# Patient Record
Sex: Female | Born: 1970 | Hispanic: Yes | Marital: Single | Smoking: Former smoker
Health system: Southern US, Community
[De-identification: ages and names within clinical notes are randomized; demographics above are authoritative.]

## PROBLEM LIST (undated history)

## (undated) DIAGNOSIS — E119 Type 2 diabetes mellitus without complications: Secondary | ICD-10-CM

## (undated) DIAGNOSIS — R7303 Prediabetes: Secondary | ICD-10-CM

## (undated) DIAGNOSIS — E785 Hyperlipidemia, unspecified: Secondary | ICD-10-CM

## (undated) DIAGNOSIS — K760 Fatty (change of) liver, not elsewhere classified: Secondary | ICD-10-CM

## (undated) HISTORY — DX: Fatty (change of) liver, not elsewhere classified: K76.0

## (undated) HISTORY — DX: Type 2 diabetes mellitus without complications: E11.9

## (undated) HISTORY — DX: Hyperlipidemia, unspecified: E78.5

## (undated) HISTORY — DX: Prediabetes: R73.03

---

## 2012-05-17 ENCOUNTER — Emergency Department (HOSPITAL_COMMUNITY): Payer: Self-pay

## 2012-05-17 ENCOUNTER — Observation Stay (HOSPITAL_COMMUNITY)
Admission: EM | Admit: 2012-05-17 | Discharge: 2012-05-19 | Disposition: A | Discharge status: Home / Self Care | Payer: Self-pay | Attending: General Surgery | Admitting: General Surgery

## 2012-05-17 ENCOUNTER — Encounter (HOSPITAL_COMMUNITY): Payer: Self-pay | Admitting: Emergency Medicine

## 2012-05-17 DIAGNOSIS — K8042 Calculus of bile duct with acute cholecystitis without obstruction: Secondary | ICD-10-CM | POA: Insufficient documentation

## 2012-05-17 DIAGNOSIS — R197 Diarrhea, unspecified: Secondary | ICD-10-CM | POA: Insufficient documentation

## 2012-05-17 DIAGNOSIS — F17200 Nicotine dependence, unspecified, uncomplicated: Secondary | ICD-10-CM | POA: Insufficient documentation

## 2012-05-17 DIAGNOSIS — R1011 Right upper quadrant pain: Principal | ICD-10-CM | POA: Insufficient documentation

## 2012-05-17 LAB — HEPATIC FUNCTION PANEL
ALT: 70 U/L — ABNORMAL HIGH (ref 0–35)
Alkaline Phosphatase: 118 U/L — ABNORMAL HIGH (ref 39–117)
Indirect Bilirubin: 0.3 mg/dL (ref 0.3–0.9)
Total Bilirubin: 0.4 mg/dL (ref 0.3–1.2)
Total Protein: 7.6 g/dL (ref 6.0–8.3)

## 2012-05-17 LAB — URINALYSIS, ROUTINE W REFLEX MICROSCOPIC
Bilirubin Urine: NEGATIVE
Ketones, ur: NEGATIVE mg/dL
Nitrite: NEGATIVE
Protein, ur: 100 mg/dL — AB
Specific Gravity, Urine: 1.015 (ref 1.005–1.030)
Urobilinogen, UA: 0.2 mg/dL (ref 0.0–1.0)

## 2012-05-17 LAB — BASIC METABOLIC PANEL
BUN: 11 mg/dL (ref 6–23)
Calcium: 9.2 mg/dL (ref 8.4–10.5)
Creatinine, Ser: 0.5 mg/dL (ref 0.50–1.10)
GFR calc Af Amer: >90 mL/min (ref >90)
GFR calc non Af Amer: >90 mL/min (ref >90)
Glucose, Bld: 96 mg/dL (ref 70–99)
Potassium: 3.7 mEq/L (ref 3.5–5.1)

## 2012-05-17 LAB — CBC WITH DIFFERENTIAL/PLATELET
Basophils Relative: 0 % (ref 0–1)
Eosinophils Absolute: 0.5 10*3/uL (ref 0.0–0.7)
Eosinophils Relative: 5 % (ref 0–5)
Hemoglobin: 13.2 g/dL (ref 12.0–15.0)
Lymphs Abs: 3.6 10*3/uL (ref 0.7–4.0)
MCH: 28.9 pg (ref 26.0–34.0)
MCHC: 34.6 g/dL (ref 30.0–36.0)
MCV: 83.6 fL (ref 78.0–100.0)
Monocytes Relative: 6 % (ref 3–12)
Neutrophils Relative %: 54 % (ref 43–77)
Platelets: 332 10*3/uL (ref 150–400)

## 2012-05-17 LAB — URINE MICROSCOPIC-ADD ON

## 2012-05-17 MED ORDER — HYDROCODONE-ACETAMINOPHEN 5-325 MG PO TABS
2.0000 | ORAL_TABLET | Freq: Once | ORAL | Status: AC
  Administered 2012-05-17: 2 via ORAL
  Filled 2012-05-17: qty 2

## 2012-05-17 NOTE — ED Provider Notes (Signed)
History     CSN: [id number]  Arrival date & time 05/17/12  1619   First MD Initiated Contact with Patient 05/17/12 1706      Chief Complaint  Patient presents with  . Abdominal Pain  . Diarrhea    (Consider location/radiation/quality/duration/timing/severity/associated sxs/prior treatment) HPI Comments: Patient is a 41 year old female with no significant past medical history who presents with an 8 day history of abdominal pain. The pain is located in the RUQ of abdomen and radiates around to her right side and back. The pain is described as cramping and severe. The pain started gradually and progressively worsened since the onset. Eating makes the pain worse. No alleviating factors. The patient has tried nothing for symptoms without relief. Associated symptoms include diarrhea. Patient denies fever, headache, NVD, chest pain, SOB, dysuria, constipation, abnormal vaginal bleeding/discharge.      History reviewed. No pertinent past medical history.  History reviewed. No pertinent past surgical history.  History reviewed. No pertinent family history.  History  Substance Use Topics  . Smoking status: Never Smoker   . Smokeless tobacco: Not on file  . Alcohol Use: No    OB History    Grav Para Term Preterm Abortions TAB SAB Ect Mult Living                  Review of Systems  Gastrointestinal: Positive for abdominal pain and diarrhea.  All other systems reviewed and are negative.    Allergies  Review of patient's allergies indicates no known allergies.  Home Medications  No current outpatient prescriptions on file.  BP 105/70  Pulse 69  Temp 98.1 F (36.7 C) (Oral)  Resp 19  Physical Exam  Nursing note and vitals reviewed. Constitutional: She is oriented to person, place, and time. She appears well-developed and well-nourished. No distress.  HENT:  Head: Normocephalic and atraumatic.  Eyes: Conjunctivae normal and EOM are normal. Pupils are equal, round, and  reactive to light.  Neck: Normal range of motion. Neck supple.  Cardiovascular: Normal rate and regular rhythm.  Exam reveals no gallop and no friction rub.   No murmur heard. Pulmonary/Chest: Effort normal and breath sounds normal. She has no wheezes. She has no rales. She exhibits no tenderness.  Abdominal: Soft. She exhibits no distension. There is tenderness. There is no rebound and no guarding.       Positive Murphy's signs. RUQ and epigastric tenderness to palpation. No suprapubic tenderness.   Musculoskeletal: Normal range of motion.  Neurological: She is alert and oriented to person, place, and time. Coordination normal.       Speech is goal-oriented. Moves limbs without ataxia.   Skin: Skin is warm and dry. She is not diaphoretic.  Psychiatric: She has a normal mood and affect. Her behavior is normal.    ED Course  Procedures (including critical care time)  Labs Reviewed  URINALYSIS, ROUTINE W REFLEX MICROSCOPIC - Abnormal; Notable for the following:    Color, Urine RED (*)  BIOCHEMICALS MAY BE AFFECTED BY COLOR   APPearance CLOUDY (*)     Hgb urine dipstick LARGE (*)     Protein, ur 100 (*)     Leukocytes, UA SMALL (*)     All other components within normal limits  URINE MICROSCOPIC-ADD ON - Abnormal; Notable for the following:    Squamous Epithelial / LPF FEW (*)     Bacteria, UA FEW (*)     All other components within normal limits  HEPATIC  FUNCTION PANEL - Abnormal; Notable for the following:    Albumin 3.3 (*)     AST 55 (*)     ALT 70 (*)     Alkaline Phosphatase 118 (*)     All other components within normal limits  CBC WITH DIFFERENTIAL  BASIC METABOLIC PANEL  LIPASE, BLOOD  POCT PREGNANCY, URINE  SURGICAL PCR SCREEN  URINE CULTURE  COMPREHENSIVE METABOLIC PANEL   US Abdomen Complete  05/17/2012  *RADIOLOGY REPORT*  Clinical Data:  Abdominal pain  COMPLETE ABDOMINAL ULTRASOUND  Comparison:  None.  Findings:  Gallbladder:  Echogenic focus in the neck of the  gallbladder compatible with gallstones.  This does not move.  Positive sonographic Murphy's sign.  Gallbladder wall is 2.5 mm.  Common bile duct:  1.9 mm  Liver:  Echogenic liver without focal liver lesion.  IVC:  Appears normal.  Pancreas:  Not evaluated due to overlying bowel gas  Spleen:  4.7 cm  Right Kidney:  10.0 cm.  Normal  Left Kidney:  10.7 cm.  Normal  Abdominal aorta:  No aneurysm identified.  IMPRESSION: Gallstone is lodged in the neck of the gallbladder.  The patient is tender over the gallbladder.  Fatty infiltration of the liver is suspected.   Original Report Authenticated By: Janeece Riggers, M.D.      No diagnosis found.    MDM  6:01 PM Patient will have US gallbladder and Vicodin for pain.   12:01 AM Patient will be admitted by General Surgery for gallbladder removal in the morning. Patient's pain is not well controlled with Vicodin and liver enzymes are slightly elevated. US shows a stone in the neck of the gallbladder.       Emilia Beck, PA-C 05/18/12 2050  Emilia Beck, PA-C 05/18/12 2051

## 2012-05-17 NOTE — ED Notes (Signed)
Pt c/o lower abd pain with diarrhea x 4 days

## 2012-05-17 NOTE — ED Notes (Signed)
Pt with ultrasound scheduled yesterday that she did not go to for gall bladder issue and still having pain; pt sts thought someone was supposed to call her but she already had scheduled; pt c/o pain  7 days; pt speaks little english

## 2012-05-17 NOTE — ED Notes (Signed)
Pt reports sudden onset of right upper quadrant pain that started 8 days ago; pt reports pain being worse with meals; pt also reports some chest discomfort but primarily c/o RUQ pain that has been present for over a week;

## 2012-05-18 ENCOUNTER — Encounter (HOSPITAL_COMMUNITY): Payer: Self-pay | Admitting: Anesthesiology

## 2012-05-18 ENCOUNTER — Encounter (HOSPITAL_COMMUNITY)
Admission: EM | Disposition: A | Discharge status: Home / Self Care | Payer: Self-pay | Source: Home / Self Care | Attending: Emergency Medicine

## 2012-05-18 ENCOUNTER — Observation Stay (HOSPITAL_COMMUNITY): Payer: Self-pay | Admitting: Anesthesiology

## 2012-05-18 ENCOUNTER — Encounter (HOSPITAL_COMMUNITY): Payer: Self-pay | Admitting: *Deleted

## 2012-05-18 DIAGNOSIS — K8010 Calculus of gallbladder with chronic cholecystitis without obstruction: Secondary | ICD-10-CM

## 2012-05-18 HISTORY — PX: CHOLECYSTECTOMY: SHX55

## 2012-05-18 LAB — URINE CULTURE: Colony Count: 3000

## 2012-05-18 LAB — SURGICAL PCR SCREEN
MRSA, PCR: NEGATIVE
Staphylococcus aureus: NEGATIVE

## 2012-05-18 SURGERY — LAPAROSCOPIC CHOLECYSTECTOMY
Anesthesia: General | Site: Abdomen | Wound class: Contaminated

## 2012-05-18 MED ORDER — ROCURONIUM BROMIDE 100 MG/10ML IV SOLN
INTRAVENOUS | Status: DC | PRN
  Administered 2012-05-18: 20 mg via INTRAVENOUS
  Administered 2012-05-18: 10 mg via INTRAVENOUS

## 2012-05-18 MED ORDER — FENTANYL CITRATE 0.05 MG/ML IJ SOLN
INTRAMUSCULAR | Status: DC | PRN
  Administered 2012-05-18 (×5): 50 ug via INTRAVENOUS

## 2012-05-18 MED ORDER — ONDANSETRON HCL 4 MG/2ML IJ SOLN
4.0000 mg | Freq: Four times a day (QID) | INTRAMUSCULAR | Status: DC | PRN
Start: 2012-05-18 — End: 2012-05-19
  Administered 2012-05-18 – 2012-05-19 (×2): 4 mg via INTRAVENOUS
  Filled 2012-05-18 (×2): qty 2

## 2012-05-18 MED ORDER — LIDOCAINE HCL 1 % IJ SOLN
INTRAMUSCULAR | Status: DC | PRN
  Administered 2012-05-18: 16:00:00

## 2012-05-18 MED ORDER — HYDROMORPHONE HCL PF 1 MG/ML IJ SOLN
1.0000 mg | INTRAMUSCULAR | Status: DC | PRN
  Administered 2012-05-18 – 2012-05-19 (×4): 1 mg via INTRAVENOUS
  Filled 2012-05-18 (×4): qty 1

## 2012-05-18 MED ORDER — SUCCINYLCHOLINE CHLORIDE 20 MG/ML IJ SOLN
INTRAMUSCULAR | Status: DC | PRN
  Administered 2012-05-18: 100 mg via INTRAVENOUS

## 2012-05-18 MED ORDER — GLYCOPYRROLATE 0.2 MG/ML IJ SOLN
INTRAMUSCULAR | Status: DC | PRN
  Administered 2012-05-18: 0.4 mg via INTRAVENOUS

## 2012-05-18 MED ORDER — DEXTROSE IN LACTATED RINGERS 5 % IV SOLN
INTRAVENOUS | Status: DC
  Administered 2012-05-18: 07:00:00 via INTRAVENOUS
  Administered 2012-05-18: 1000 mL via INTRAVENOUS
  Administered 2012-05-19: 06:00:00 via INTRAVENOUS

## 2012-05-18 MED ORDER — HYDROMORPHONE HCL PF 1 MG/ML IJ SOLN
0.2500 mg | INTRAMUSCULAR | Status: DC | PRN
  Administered 2012-05-18 (×3): 0.5 mg via INTRAVENOUS

## 2012-05-18 MED ORDER — ONDANSETRON HCL 4 MG/2ML IJ SOLN: 4.0000 mg | Freq: Once | INTRAMUSCULAR | Status: DC | PRN

## 2012-05-18 MED ORDER — PROPOFOL 10 MG/ML IV BOLUS
INTRAVENOUS | Status: DC | PRN
  Administered 2012-05-18: 150 mg via INTRAVENOUS
  Administered 2012-05-18: 50 mg via INTRAVENOUS

## 2012-05-18 MED ORDER — PIPERACILLIN-TAZOBACTAM 3.375 G IVPB
3.3750 g | Freq: Three times a day (TID) | INTRAVENOUS | Status: DC
  Administered 2012-05-18 (×3): 3.375 g via INTRAVENOUS
  Filled 2012-05-18 (×5): qty 50

## 2012-05-18 MED ORDER — SODIUM CHLORIDE 0.9 % IR SOLN
Status: DC | PRN
  Administered 2012-05-18: 1000 mL

## 2012-05-18 MED ORDER — ONDANSETRON HCL 4 MG/2ML IJ SOLN
INTRAMUSCULAR | Status: DC | PRN
  Administered 2012-05-18: 4 mg via INTRAVENOUS

## 2012-05-18 MED ORDER — LIDOCAINE HCL (PF) 1 % IJ SOLN
INTRAMUSCULAR | Status: AC
  Filled 2012-05-18: qty 30

## 2012-05-18 MED ORDER — PANTOPRAZOLE SODIUM 40 MG IV SOLR
40.0000 mg | Freq: Every day | INTRAVENOUS | Status: DC
  Administered 2012-05-18 (×2): 40 mg via INTRAVENOUS
  Filled 2012-05-18 (×3): qty 40

## 2012-05-18 MED ORDER — PIPERACILLIN-TAZOBACTAM 3.375 G IVPB
3.3750 g | Freq: Three times a day (TID) | INTRAVENOUS | Status: DC
  Administered 2012-05-18 – 2012-05-19 (×2): 3.375 g via INTRAVENOUS
  Filled 2012-05-18 (×3): qty 50

## 2012-05-18 MED ORDER — 0.9 % SODIUM CHLORIDE (POUR BTL) OPTIME
TOPICAL | Status: DC | PRN
  Administered 2012-05-18: 1000 mL

## 2012-05-18 MED ORDER — MIDAZOLAM HCL 5 MG/5ML IJ SOLN
INTRAMUSCULAR | Status: DC | PRN
  Administered 2012-05-18: 2 mg via INTRAVENOUS

## 2012-05-18 MED ORDER — LIDOCAINE HCL (CARDIAC) 20 MG/ML IV SOLN
INTRAVENOUS | Status: DC | PRN
  Administered 2012-05-18: 60 mg via INTRAVENOUS

## 2012-05-18 MED ORDER — BUPIVACAINE-EPINEPHRINE PF 0.25-1:200000 % IJ SOLN
INTRAMUSCULAR | Status: AC
  Filled 2012-05-18: qty 30

## 2012-05-18 MED ORDER — NEOSTIGMINE METHYLSULFATE 1 MG/ML IJ SOLN
INTRAMUSCULAR | Status: DC | PRN
  Administered 2012-05-18: 3 mg via INTRAVENOUS

## 2012-05-18 MED ORDER — SODIUM CHLORIDE 0.9 % IV SOLN: INTRAVENOUS | Status: DC | PRN

## 2012-05-18 MED ORDER — LACTATED RINGERS IV SOLN
INTRAVENOUS | Status: DC
  Administered 2012-05-18: 15:00:00 via INTRAVENOUS

## 2012-05-18 MED ORDER — HYDROMORPHONE HCL PF 1 MG/ML IJ SOLN
INTRAMUSCULAR | Status: AC
  Filled 2012-05-18: qty 1

## 2012-05-18 SURGICAL SUPPLY — 49 items
APPLIER CLIP ROT 10 11.4 M/L (STAPLE) ×2
BLADE SURG ROTATE 9660 (MISCELLANEOUS) IMPLANT
CANISTER SUCTION 2500CC (MISCELLANEOUS) ×2 IMPLANT
CHLORAPREP W/TINT 26ML (MISCELLANEOUS) ×4 IMPLANT
CLIP APPLIE ROT 10 11.4 M/L (STAPLE) ×1 IMPLANT
CLOTH BEACON ORANGE TIMEOUT ST (SAFETY) ×2 IMPLANT
COVER MAYO STAND STRL (DRAPES) ×2 IMPLANT
COVER SURGICAL LIGHT HANDLE (MISCELLANEOUS) ×2 IMPLANT
DECANTER SPIKE VIAL GLASS SM (MISCELLANEOUS) ×4 IMPLANT
DERMABOND ADHESIVE PROPEN (GAUZE/BANDAGES/DRESSINGS) ×1
DERMABOND ADVANCED (GAUZE/BANDAGES/DRESSINGS) ×1
DERMABOND ADVANCED .7 DNX12 (GAUZE/BANDAGES/DRESSINGS) ×1 IMPLANT
DERMABOND ADVANCED .7 DNX6 (GAUZE/BANDAGES/DRESSINGS) ×1 IMPLANT
DRAPE C-ARM 42X72 X-RAY (DRAPES) ×2 IMPLANT
DRAPE UTILITY 15X26 W/TAPE STR (DRAPE) ×4 IMPLANT
DRAPE WARM FLUID 44X44 (DRAPE) ×2 IMPLANT
ELECT REM PT RETURN 9FT ADLT (ELECTROSURGICAL) ×2
ELECTRODE REM PT RTRN 9FT ADLT (ELECTROSURGICAL) ×1 IMPLANT
FILTER SMOKE EVAC LAPAROSHD (FILTER) IMPLANT
GLOVE BIO SURGEON STRL SZ 6 (GLOVE) ×2 IMPLANT
GLOVE BIOGEL PI IND STRL 6.5 (GLOVE) ×2 IMPLANT
GLOVE BIOGEL PI INDICATOR 6.5 (GLOVE) ×2
GLOVE SURG SS PI 6.0 STRL IVOR (GLOVE) ×2 IMPLANT
GLOVE SURG SS PI 7.5 STRL IVOR (GLOVE) ×2 IMPLANT
GOWN PREVENTION PLUS XXLARGE (GOWN DISPOSABLE) ×2 IMPLANT
GOWN STRL NON-REIN LRG LVL3 (GOWN DISPOSABLE) ×6 IMPLANT
HEMOSTAT SNOW SURGICEL 2X4 (HEMOSTASIS) ×2 IMPLANT
KIT BASIN OR (CUSTOM PROCEDURE TRAY) ×2 IMPLANT
KIT ROOM TURNOVER OR (KITS) ×2 IMPLANT
NS IRRIG 1000ML POUR BTL (IV SOLUTION) ×2 IMPLANT
PAD ARMBOARD 7.5X6 YLW CONV (MISCELLANEOUS) ×4 IMPLANT
POUCH SPECIMEN RETRIEVAL 10MM (ENDOMECHANICALS) ×2 IMPLANT
PUNCH CORNEAL DONOR 7.5MM (MISCELLANEOUS) ×2 IMPLANT
SCISSORS LAP 5X35 DISP (ENDOMECHANICALS) ×2 IMPLANT
SET CHOLANGIOGRAPH 5 50 .035 (SET/KITS/TRAYS/PACK) ×2 IMPLANT
SET IRRIG TUBING LAPAROSCOPIC (IRRIGATION / IRRIGATOR) ×2 IMPLANT
SLEEVE ENDOPATH XCEL 5M (ENDOMECHANICALS) ×4 IMPLANT
SLEEVE Z-THREAD 5X100MM (TROCAR) IMPLANT
SPECIMEN JAR SMALL (MISCELLANEOUS) ×2 IMPLANT
SUT MNCRL AB 4-0 PS2 18 (SUTURE) ×2 IMPLANT
TOWEL OR 17X24 6PK STRL BLUE (TOWEL DISPOSABLE) ×2 IMPLANT
TOWEL OR 17X26 10 PK STRL BLUE (TOWEL DISPOSABLE) ×2 IMPLANT
TRAY LAPAROSCOPIC (CUSTOM PROCEDURE TRAY) ×2 IMPLANT
TROCAR XCEL BLUNT TIP 100MML (ENDOMECHANICALS) ×2 IMPLANT
TROCAR XCEL NON-BLD 11X100MML (ENDOMECHANICALS) ×2 IMPLANT
TROCAR XCEL NON-BLD 5MMX100MML (ENDOMECHANICALS) ×2 IMPLANT
TROCAR Z-THREAD FIOS 11X100 BL (TROCAR) IMPLANT
TROCAR Z-THREAD FIOS 5X100MM (TROCAR) IMPLANT
WATER STERILE IRR 1000ML POUR (IV SOLUTION) IMPLANT

## 2012-05-18 NOTE — H&P (Signed)
Samaiyah Howes is an 41 y.o. female.   Chief Complaint: right upper quadrant pain HPI: the patient is a 41 year old female with right upper quadrant pain that started approximately one day ago. The patient states that the pain began after eating. She states she's had some nausea since being in the ED. Patient states she has not had any pain like this in the past.  History reviewed. No pertinent past medical history.  History reviewed. No pertinent past surgical history.  History reviewed. No pertinent family history. Social History:  reports that she has never smoked. She does not have any smokeless tobacco history on file. She reports that she does not drink alcohol or use illicit drugs.  Allergies: No Known Allergies   (Not in a hospital admission)  Results for orders placed during the hospital encounter of 05/17/12 (from the past 48 hour(s))  CBC WITH DIFFERENTIAL     Status: Normal   Collection Time   05/17/12  4:55 PM      Component Value Range Comment   WBC 10.2  4.0 - 10.5 K/uL    RBC 4.57  3.87 - 5.11 MIL/uL    Hemoglobin 13.2  12.0 - 15.0 g/dL    HCT 16.1  09.6 - 04.5 %    MCV 83.6  78.0 - 100.0 fL    MCH 28.9  26.0 - 34.0 pg    MCHC 34.6  30.0 - 36.0 g/dL    RDW 40.9  81.1 - 91.4 %    Platelets 332  150 - 400 K/uL    Neutrophils Relative 54  43 - 77 %    Neutro Abs 5.5  1.7 - 7.7 K/uL    Lymphocytes Relative 35  12 - 46 %    Lymphs Abs 3.6  0.7 - 4.0 K/uL    Monocytes Relative 6  3 - 12 %    Monocytes Absolute 0.6  0.1 - 1.0 K/uL    Eosinophils Relative 5  0 - 5 %    Eosinophils Absolute 0.5  0.0 - 0.7 K/uL    Basophils Relative 0  0 - 1 %    Basophils Absolute 0.0  0.0 - 0.1 K/uL   BASIC METABOLIC PANEL     Status: Normal   Collection Time   05/17/12  4:55 PM      Component Value Range Comment   Sodium 135  135 - 145 mEq/L    Potassium 3.7  3.5 - 5.1 mEq/L    Chloride 102  96 - 112 mEq/L    CO2 23  19 - 32 mEq/L    Glucose, Bld 96  70 - 99 mg/dL    BUN 11   6 - 23 mg/dL    Creatinine, Ser 7.82  0.50 - 1.10 mg/dL    Calcium 9.2  8.4 - 95.6 mg/dL    GFR calc non Af Amer >90  >90 mL/min    GFR calc Af Amer >90  >90 mL/min   LIPASE, BLOOD     Status: Normal   Collection Time   05/17/12  4:55 PM      Component Value Range Comment   Lipase 23  11 - 59 U/L   POCT PREGNANCY, URINE     Status: Normal   Collection Time   05/17/12  5:04 PM      Component Value Range Comment   Preg Test, Ur NEGATIVE  NEGATIVE   URINALYSIS, ROUTINE W REFLEX MICROSCOPIC     Status: Abnormal  Collection Time   05/17/12  5:07 PM      Component Value Range Comment   Color, Urine RED (*) YELLOW BIOCHEMICALS MAY BE AFFECTED BY COLOR   APPearance CLOUDY (*) CLEAR    Specific Gravity, Urine 1.015  1.005 - 1.030    pH 5.5  5.0 - 8.0    Glucose, UA NEGATIVE  NEGATIVE mg/dL    Hgb urine dipstick LARGE (*) NEGATIVE    Bilirubin Urine NEGATIVE  NEGATIVE    Ketones, ur NEGATIVE  NEGATIVE mg/dL    Protein, ur 161 (*) NEGATIVE mg/dL    Urobilinogen, UA 0.2  0.0 - 1.0 mg/dL    Nitrite NEGATIVE  NEGATIVE    Leukocytes, UA SMALL (*) NEGATIVE   URINE MICROSCOPIC-ADD ON     Status: Abnormal   Collection Time   05/17/12  5:07 PM      Component Value Range Comment   Squamous Epithelial / LPF FEW (*) RARE    WBC, UA 3-6  <3 WBC/hpf    RBC / HPF TOO NUMEROUS TO COUNT  <3 RBC/hpf    Bacteria, UA FEW (*) RARE   HEPATIC FUNCTION PANEL     Status: Abnormal   Collection Time   05/17/12  8:33 PM      Component Value Range Comment   Total Protein 7.6  6.0 - 8.3 g/dL    Albumin 3.3 (*) 3.5 - 5.2 g/dL    AST 55 (*) 0 - 37 U/L    ALT 70 (*) 0 - 35 U/L    Alkaline Phosphatase 118 (*) 39 - 117 U/L    Total Bilirubin 0.4  0.3 - 1.2 mg/dL    Bilirubin, Direct 0.1  0.0 - 0.3 mg/dL    Indirect Bilirubin 0.3  0.3 - 0.9 mg/dL    US Abdomen Complete  05/17/2012  *RADIOLOGY REPORT*  Clinical Data:  Abdominal pain  COMPLETE ABDOMINAL ULTRASOUND  Comparison:  None.  Findings:  Gallbladder:   Echogenic focus in the neck of the gallbladder compatible with gallstones.  This does not move.  Positive sonographic Murphy's sign.  Gallbladder wall is 2.5 mm.  Common bile duct:  1.9 mm  Liver:  Echogenic liver without focal liver lesion.  IVC:  Appears normal.  Pancreas:  Not evaluated due to overlying bowel gas  Spleen:  4.7 cm  Right Kidney:  10.0 cm.  Normal  Left Kidney:  10.7 cm.  Normal  Abdominal aorta:  No aneurysm identified.  IMPRESSION: Gallstone is lodged in the neck of the gallbladder.  The patient is tender over the gallbladder.  Fatty infiltration of the liver is suspected.   Original Report Authenticated By: Janeece Riggers, M.D.     Review of Systems  Unable to perform ROS HENT: Negative.   Eyes: Negative.   Respiratory: Negative.   Cardiovascular: Negative.   Gastrointestinal: Positive for nausea and abdominal pain.  Genitourinary: Negative.   Musculoskeletal: Negative.   Skin: Negative.     Blood pressure 105/70, pulse 69, temperature 98.1 F (36.7 C), temperature source Oral, resp. rate 19. Physical Exam  Constitutional: She is oriented to person, place, and time. She appears well-developed and well-nourished.  HENT:  Head: Normocephalic and atraumatic.  Eyes: Conjunctivae normal and EOM are normal. Pupils are equal, round, and reactive to light.  Neck: Normal range of motion.  Cardiovascular: Normal rate, regular rhythm and normal heart sounds.   Respiratory: Effort normal and breath sounds normal.  GI: Soft. Bowel sounds are normal.  She exhibits no mass. There is tenderness (ruq). There is no rebound and no guarding.  Musculoskeletal: Normal range of motion.  Neurological: She is alert and oriented to person, place, and time.     Assessment/Plan Patient is a 41 year old female with symptomatic choledocholithiasis and unrelenting right upper quadrant pain  1. Patient be kept n.p.o., and started on IV fluids. 2.I discussed the need for the patient to undergo a  laparoscopic cholecystectomy, and the likelihood of symptoms to return to the patient leaves the ED. All questions were answered and the patient wished to proceed with the operation. 3.All risks and benefits were discussed with the patient, to generally include infection, bleeding, damage to surrounding structures, and recurrence. Alternatives were offered and described.  All questions were answered and the patient voiced understanding of the procedure and wishes to proceed at this point.   Marigene Ehlers., Nathanyal Ashmead 05/18/2012, 12:06 AM

## 2012-05-18 NOTE — Anesthesia Preprocedure Evaluation (Addendum)
Anesthesia Evaluation  Patient identified by MRN, date of birth, ID band Patient awake    Reviewed: Allergy & Precautions, H&P , NPO status , Patient's Chart, lab work & pertinent test results  Airway       Dental  (+) Dental Advisory Given and Teeth Intact   Pulmonary Current Smoker,          Cardiovascular     Neuro/Psych    GI/Hepatic GERD-  ,  Endo/Other    Renal/GU      Musculoskeletal   Abdominal   Peds  Hematology   Anesthesia Other Findings   Reproductive/Obstetrics                          Anesthesia Physical Anesthesia Plan  ASA: I  Anesthesia Plan: General   Post-op Pain Management:    Induction: Intravenous  Airway Management Planned: Oral ETT  Additional Equipment:   Intra-op Plan:   Post-operative Plan: Extubation in OR  Informed Consent: I have reviewed the patients History and Physical, chart, labs and discussed the procedure including the risks, benefits and alternatives for the proposed anesthesia with the patient or authorized representative who has indicated his/her understanding and acceptance.     Plan Discussed with: CRNA, Anesthesiologist and Surgeon  Anesthesia Plan Comments:         Anesthesia Quick Evaluation

## 2012-05-18 NOTE — Transfer of Care (Signed)
Immediate Anesthesia Transfer of Care Note  Patient: Bonnie Palmer  Procedure(s) Performed: Procedure(s) (LRB) with comments: LAPAROSCOPIC CHOLECYSTECTOMY (N/A)  Patient Location: PACU  Anesthesia Type:General  Level of Consciousness: awake, alert  and oriented  Airway & Oxygen Therapy: Patient Spontanous Breathing  Post-op Assessment: Report given to PACU RN and Post -op Vital signs reviewed and stable  Post vital signs: Reviewed and stable  Complications: No apparent anesthesia complications

## 2012-05-18 NOTE — Progress Notes (Signed)
Nutrition Brief Note  Patient identified on the Malnutrition Screening Tool (MST) Report. Upon chart review, no nutrition issues identified.  Body mass index is 34.88 kg/(m^2). Pt meets criteria for Obese Class I based on current BMI.   Current diet order is NPO. Labs and medications reviewed.   No nutrition interventions warranted at this time. If nutrition issues arise, please consult RD.   Jarold Motto MS, RD, LDN Pager: [phone] After-hours pager: [phone]

## 2012-05-18 NOTE — Progress Notes (Signed)
ARRIVED FROM PACU, SLEEPY BUT EASILY AROUSABLE, FRIENDS AT BEDSIDE, DENIES NAUSEA/PAIN AT THIS TIME

## 2012-05-18 NOTE — Anesthesia Procedure Notes (Signed)
Procedure Name: Intubation Date/Time: 05/18/2012 3:17 PM Performed by: Elon Alas Pre-anesthesia Checklist: Patient identified, Timeout performed, Emergency Drugs available, Suction available and Patient being monitored Patient Re-evaluated:Patient Re-evaluated prior to inductionOxygen Delivery Method: Circle system utilized Preoxygenation: Pre-oxygenation with 100% oxygen Intubation Type: IV induction Ventilation: Mask ventilation without difficulty Laryngoscope Size: Mac and 3 Grade View: Grade I Tube type: Oral Tube size: 7.0 mm Number of attempts: 1 Airway Equipment and Method: Stylet Placement Confirmation: positive ETCO2,  ETT inserted through vocal cords under direct vision and breath sounds checked- equal and bilateral Secured at: 20 cm Tube secured with: Tape Dental Injury: Teeth and Oropharynx as per pre-operative assessment  Comments: Atraumatic intubation, however pt had limited mouth opening once asleep.

## 2012-05-18 NOTE — Preoperative (Signed)
Beta Blockers   Reason not to administer Beta Blockers:Not Applicable 

## 2012-05-18 NOTE — Op Note (Signed)
Laparoscopic Cholecystectomy Procedure Note  Indications: This patient presents with acute cholecystitis and cholelithiasis and will undergo laparoscopic cholecystectomy.  Pre-operative Diagnosis: see above  Post-operative Diagnosis: Same  Surgeon: Almond Lint   Assistants: Barnetta Chapel, PA-C  Anesthesia: General endotracheal anesthesia and local  ASA Class: 2  Procedure Details  The patient was seen again in the Holding Room. The risks, benefits, complications, treatment options, and expected outcomes were discussed with the patient. The possibilities of  bleeding, recurrent infection, damage to nearby structures, the need for additional procedures, failure to diagnose a condition, the possible need to convert to an open procedure, and creating a complication requiring transfusion or operation were discussed with the patient. The likelihood of improving the patient's symptoms with return to their baseline status is good.    The patient and/or family concurred with the proposed plan, giving informed consent. The site of surgery properly noted. The patient was taken to Operating Room, and the procedure verified as Laparoscopic Cholecystectomy with Intraoperative Cholangiogram. A Time Out was held and the above information confirmed.  Prior to the induction of general anesthesia, antibiotic prophylaxis was administered. General endotracheal anesthesia was then administered and tolerated well. After the induction, the abdomen was prepped with Chloraprep and draped in the sterile fashion. The patient was positioned in the supine position.  Local anesthetic agent was injected into the skin near the umbilicus and an incision made. We dissected down to the abdominal fascia with blunt dissection.  The fascia was incised vertically and we entered the peritoneal cavity bluntly.  A pursestring suture of 0-Vicryl was placed around the fascial opening.  The Hasson cannula was inserted and secured with  the stay suture.  Pneumoperitoneum was then created with CO2 and tolerated well without any adverse changes in the patient's vital signs. An 11-mm port was placed in the subxiphoid position.  Two 5-mm ports were placed in the right upper quadrant. All skin incisions were infiltrated with a local anesthetic agent before making the incision and placing the trocars.   We positioned the patient in reverse Trendelenburg, tilted slightly to the patient's left. The gallbladder was very distended.  It was aspirated with a Nezhat suction with clear bile.  The omental adhesions were taken down with cautery. The gallbladder was identified, the fundus grasped and retracted cephalad. Adhesions were lysed bluntly and with the electrocautery where indicated, taking care not to injure any adjacent organs or viscus. The infundibulum was grasped and retracted laterally, exposing the peritoneum overlying the triangle of Calot. This was then divided and exposed in a blunt fashion. A critical view of the cystic duct and cystic artery was obtained.  The cystic duct was clearly identified and bluntly dissected circumferentially. The cystic duct was ligated with a clip distally.   An incision was made in the cystic duct and the [facility] cholangiogram catheter was attempted to be introduced. The catheter met resistance and would not pass despite several attempts.  The cholangiogram was aborted.  The cystic duct was then ligated with clips and divided. The cystic artery was identified, dissected free, ligated with clips and divided as well.   The gallbladder was dissected from the liver bed in retrograde fashion with the electrocautery. The gallbladder was removed and placed in an Endocatch bag.  The gallbladder and Endocatch bag were then removed through the umbilical port site.  The liver bed was irrigated and inspected. Hemostasis was achieved with the electrocautery. Copious irrigation was utilized and was repeatedly aspirated until  clear.  We again inspected the right upper quadrant for hemostasis.  Pneumoperitoneum was released as we removed the trocars.   The pursestring suture was used to close the umbilical fascia.  4-0 Monocryl was used to close the skin.   The skin was cleaned and dry, and Dermabond was applied. The patient was then extubated and brought to the recovery room in stable condition. Instrument, sponge, and needle counts were correct at closure and at the conclusion of the case.   Findings: Acute cholecystitis with cholelithiasis, hydrops of the gallbladder and aborted cholangiogram, but normal caliber cystic duct.    Estimated Blood Loss: <25 mL         Drains: none          Specimens: Gallbladder to pathology       Complications: None; patient tolerated the procedure well.         Disposition: PACU - hemodynamically stable.         Condition: stable

## 2012-05-18 NOTE — Progress Notes (Signed)
Patient ID: Bonnie Palmer, female   DOB: 1971-03-06, 41 y.o.   MRN: [id number] Patient feels ok.  Spoke to her via Nurse, learning disability on my phone.  She has no questions about surgery.  I informed her it would be this afternoon.  She understands and is agreeable to proceed with surgery for her gallbladder.  Nary Sneed E 05/18/2012 9:10 AM

## 2012-05-19 ENCOUNTER — Observation Stay (HOSPITAL_COMMUNITY): Payer: Self-pay

## 2012-05-19 ENCOUNTER — Encounter (HOSPITAL_COMMUNITY): Payer: Self-pay | Admitting: General Surgery

## 2012-05-19 LAB — COMPREHENSIVE METABOLIC PANEL
AST: 120 U/L — ABNORMAL HIGH (ref 0–37)
BUN: 5 mg/dL — ABNORMAL LOW (ref 6–23)
CO2: 26 mEq/L (ref 19–32)
Chloride: 101 mEq/L (ref 96–112)
Creatinine, Ser: 0.64 mg/dL (ref 0.50–1.10)
GFR calc Af Amer: >90 mL/min (ref >90)
GFR calc non Af Amer: >90 mL/min (ref >90)
Glucose, Bld: 116 mg/dL — ABNORMAL HIGH (ref 70–99)
Total Bilirubin: 0.3 mg/dL (ref 0.3–1.2)

## 2012-05-19 MED ORDER — OXYCODONE-ACETAMINOPHEN 5-325 MG PO TABS: 1.0000 | ORAL_TABLET | ORAL | Status: DC | PRN

## 2012-05-19 MED ORDER — OXYCODONE-ACETAMINOPHEN 5-325 MG PO TABS
1.0000 | ORAL_TABLET | ORAL | Status: DC | PRN
  Administered 2012-05-19: 2 via ORAL
  Filled 2012-05-19: qty 2

## 2012-05-19 NOTE — Discharge Summary (Signed)
Agree with above.   I reviewed with family indications to call including fever/ jaundice.

## 2012-05-19 NOTE — Progress Notes (Signed)
UR completed 

## 2012-05-19 NOTE — Progress Notes (Signed)
Interpreter Naithen [place] Namihira for Carol RN °

## 2012-05-19 NOTE — Anesthesia Postprocedure Evaluation (Signed)
  Anesthesia Post-op Note  Patient: Bonnie Palmer  Procedure(s) Performed: Procedure(s) (LRB) with comments: LAPAROSCOPIC CHOLECYSTECTOMY (N/A)  Patient Location: PACU  Anesthesia Type:General  Level of Consciousness: awake  Airway and Oxygen Therapy: Patient Spontanous Breathing  Post-op Pain: mild  Post-op Assessment: Post-op Vital signs reviewed  Post-op Vital Signs: stable  Complications: No apparent anesthesia complications

## 2012-05-19 NOTE — Discharge Summary (Signed)
Patient ID: Larya Charpentier MRN: [id number] DOB/AGE: 1971/04/28 41 y.o.  Admit date: 05/17/2012 Discharge date: 05/19/2012  Procedures: laparoscopic cholecystectomy with IOC  Consults: None  Reason for Admission: the patient is a 41 year old female with right upper quadrant pain that started approximately one day ago. The patient states that the pain began after eating. She states she's had some nausea since being in the ED. Patient states she has not had any pain like this in the past.  Admission Diagnoses:  1. Acute cholecystitis  Hospital Course:  The patient was admitted and taken to the operating room where she underwent a lap chole.  She tolerated the procedure well.  On POD#1, her pain was well controlled and she was tolerating a regular diet.  She was stable for dc home.  PE: Abd: soft, appropriately tender, +BS, ND, incisions c/d/i  Discharge Diagnoses:  1. Acute cholecystitis, s/p lap chole   Discharge Medications:   Medication List     As of 05/19/2012  9:31 AM    TAKE these medications         omeprazole 20 MG capsule   Commonly known as: PRILOSEC   Take 20 mg by mouth 2 (two) times daily.      oxyCODONE-acetaminophen 5-325 MG per tablet   Commonly known as: PERCOCET/ROXICET   Take 1-2 tablets by mouth every 4 (four) hours as needed for pain.        Discharge Instructions: Follow up with [facility] the week clinic 06-21-12 at 2:00pm  Signed: Milan Perkins E 05/19/2012, 9:31 AM

## 2012-05-20 NOTE — ED Provider Notes (Signed)
Medical screening examination/treatment/procedure(s) were performed by non-physician practitioner and as supervising physician I was immediately available for consultation/collaboration.   Charles B. Sheldon, MD 05/20/12 0658 

## 2012-06-21 ENCOUNTER — Encounter (INDEPENDENT_AMBULATORY_CARE_PROVIDER_SITE_OTHER): Payer: Self-pay

## 2012-06-21 ENCOUNTER — Ambulatory Visit (INDEPENDENT_AMBULATORY_CARE_PROVIDER_SITE_OTHER): Payer: Self-pay | Admitting: General Surgery

## 2012-06-21 VITALS — BP 106/68 | HR 72 | Temp 98.0°F | Resp 14 | Ht <= 58 in | Wt 157.0 lb

## 2012-06-21 DIAGNOSIS — K8010 Calculus of gallbladder with chronic cholecystitis without obstruction: Secondary | ICD-10-CM

## 2012-06-21 DIAGNOSIS — Z9889 Other specified postprocedural states: Secondary | ICD-10-CM

## 2012-06-21 NOTE — Patient Instructions (Signed)
Follow up as needed

## 2012-06-21 NOTE — Progress Notes (Signed)
Bonnie Palmer September 19, 1970 [id number] 06/21/2012   Bonnie Palmer is a 42 y.o. female who had a laparoscopic cholecystectomy with intraoperative cholangiogram by Dr. Donell Beers.  The pathology report confirmed chronic cholecystitis with cholelithiasis.  The patient reports that they are feeling well with normal bowel movements and good appetite.  The pre-operative symptoms of abdominal pain, nausea, and vomiting have resolved.    Physical examination - Incisions appear well-healed with no sign of infection or bleeding.   Abdomen - soft, non-tender  Impression:  s/p laparoscopic cholecystectomy  Plan:  She may resume a regular diet and full activity.  She may follow-up on a PRN basis.

## 2014-09-04 IMAGING — US US ABDOMEN COMPLETE
1 series · 14 of 25 positions shown · non-contrast
Comparison: None.

CLINICAL DATA: Abdominal pain

COMPLETE ABDOMINAL ULTRASOUND

[Series 1: us abdomen complete · 0.33mm/px · 14 of 78 slices shown]
[im 1/78]
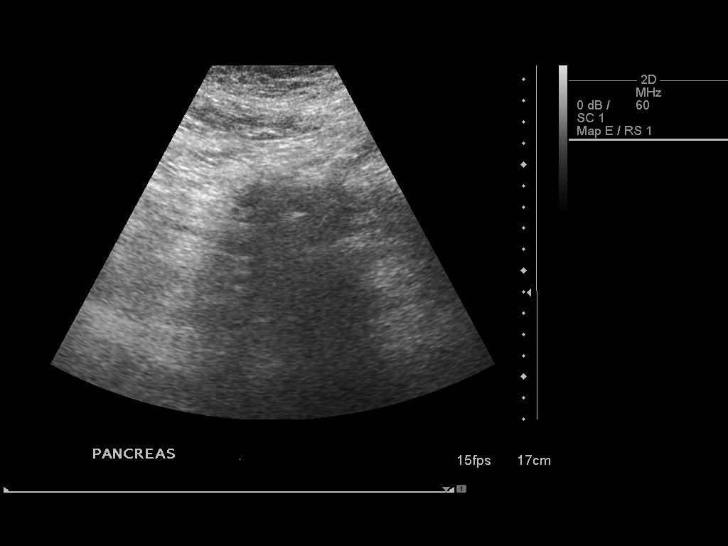
[im 7/78]
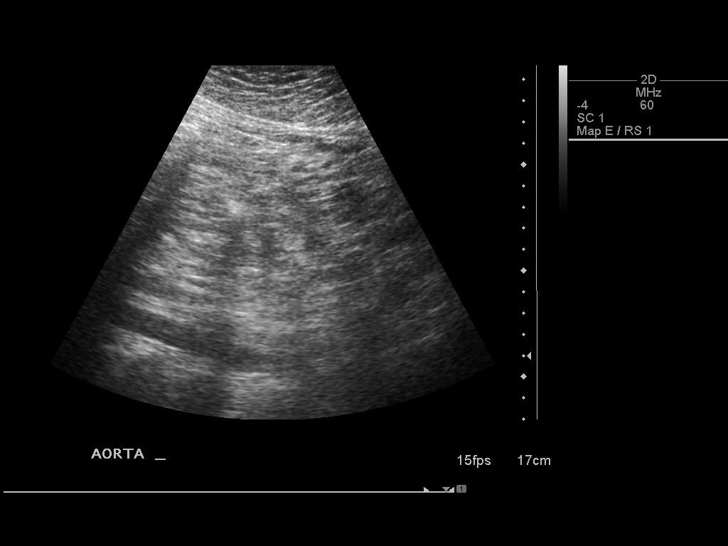
[im 13/78]
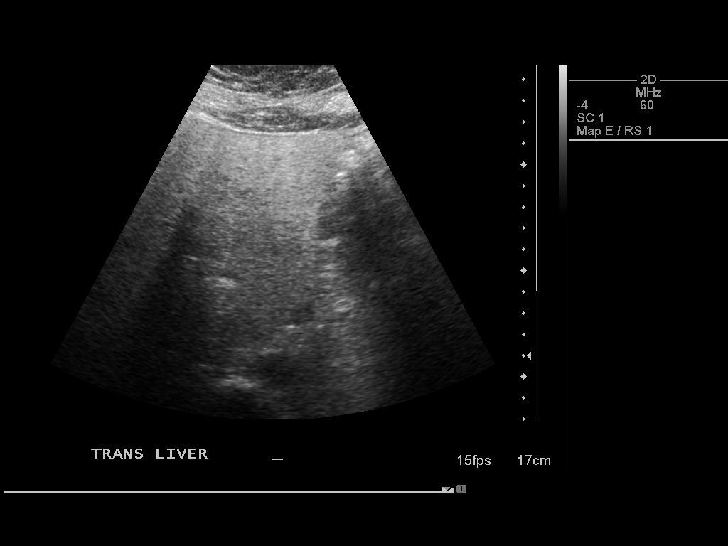
[im 20/78]
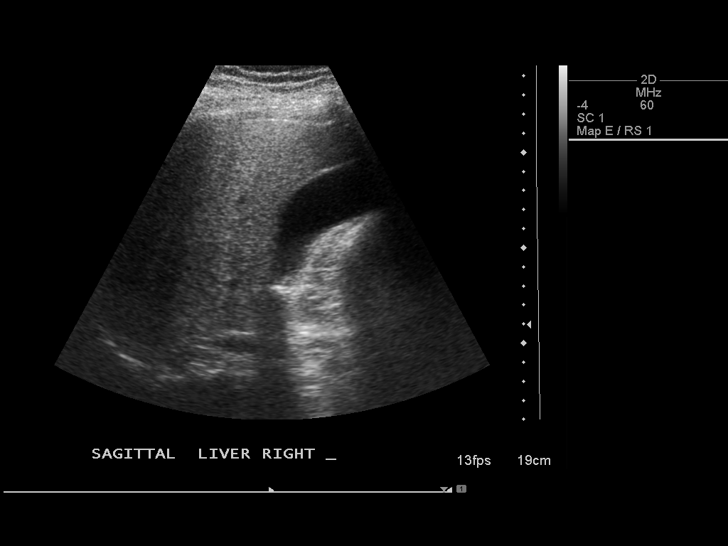
[im 26/78]
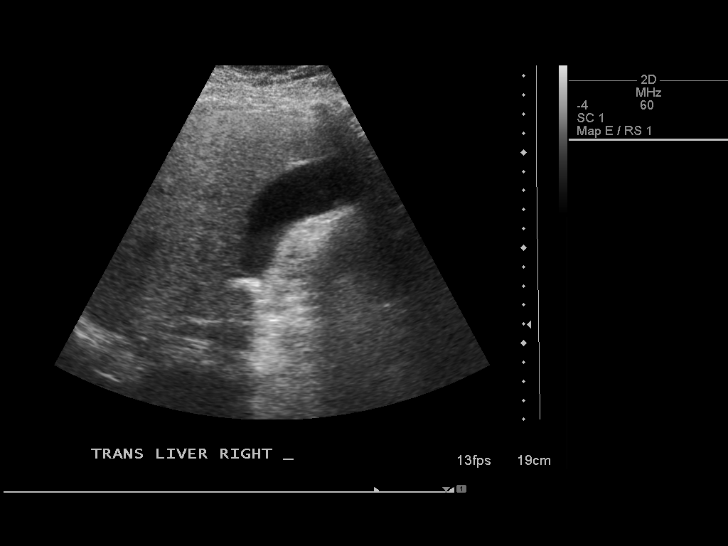
[im 29/78]
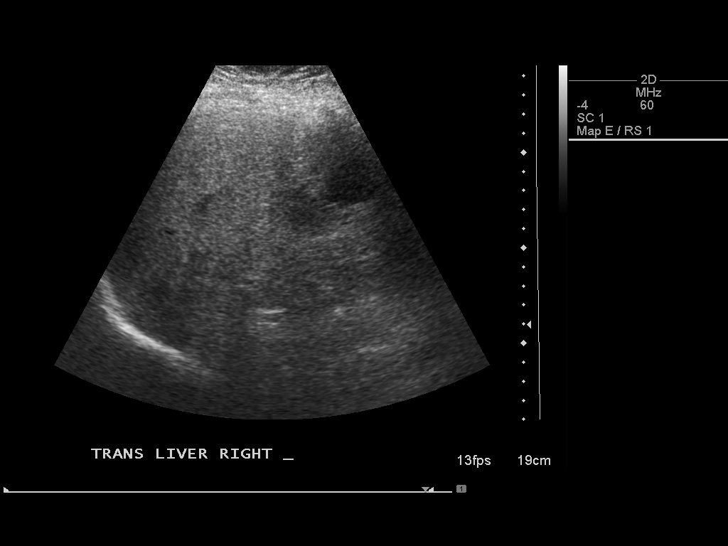
[im 36/78]
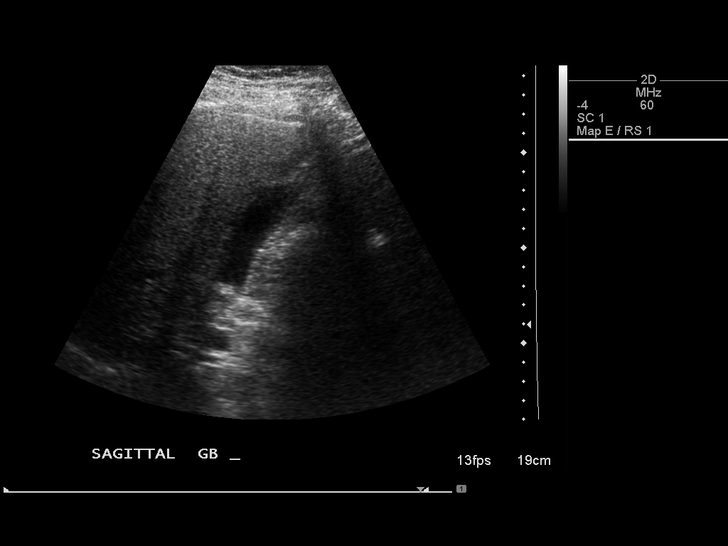
[im 42/78]
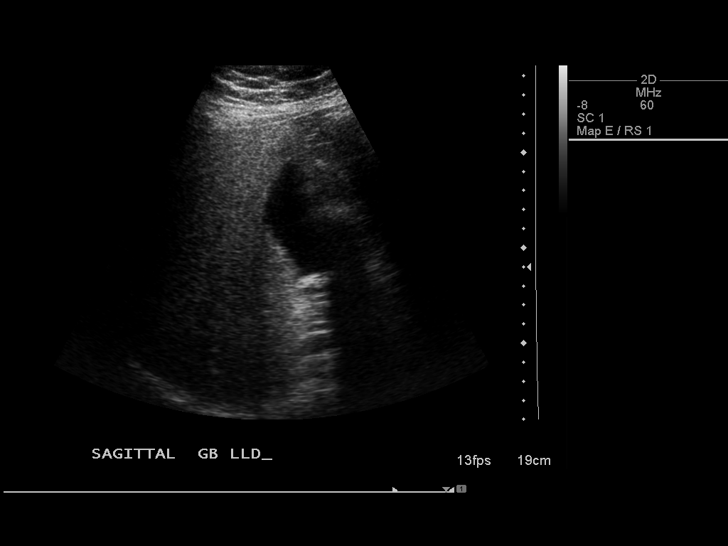
[im 49/78]
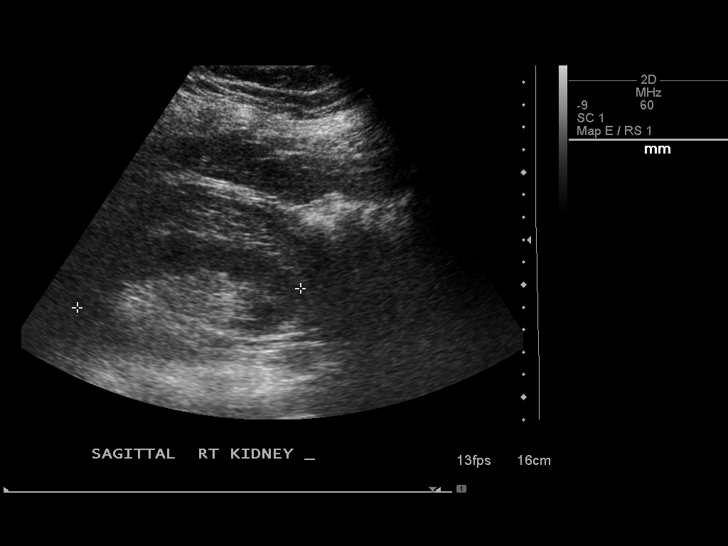
[im 52/78]
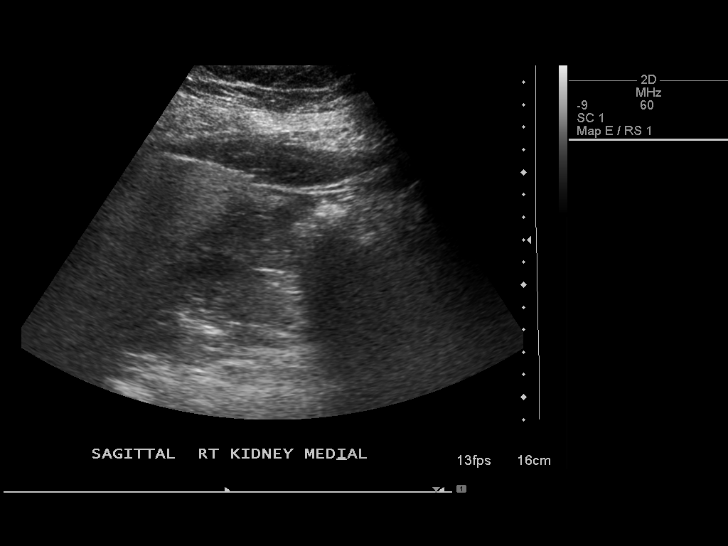
[im 58/78]
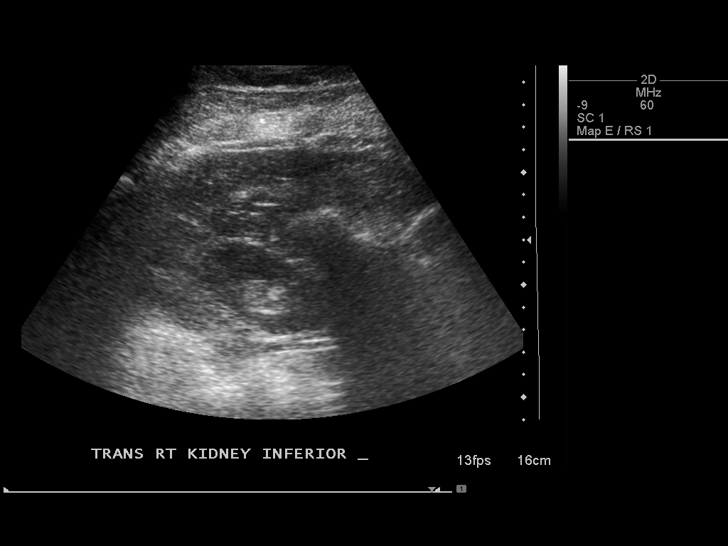
[im 65/78]
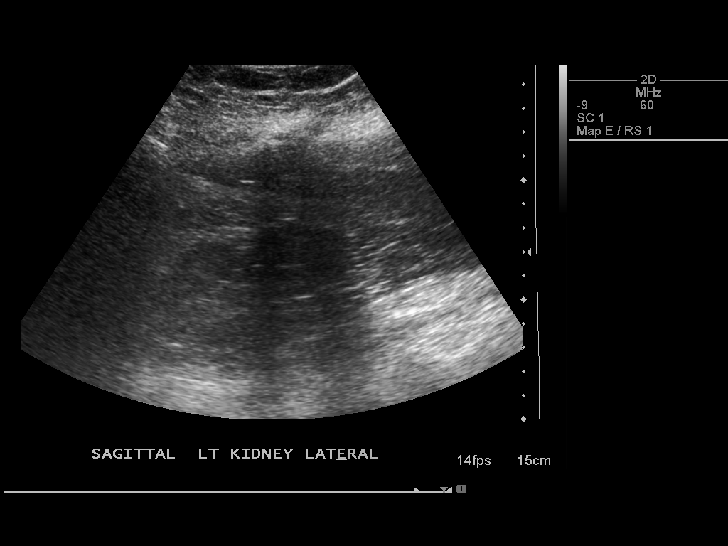
[im 71/78]
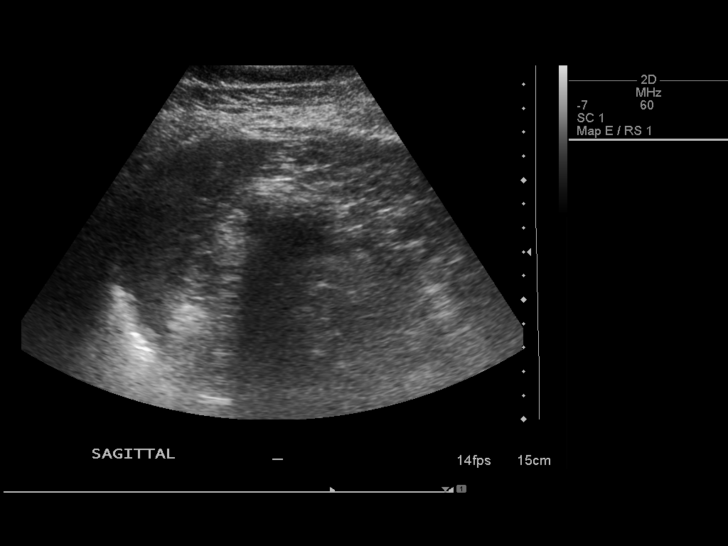
[im 78/78]
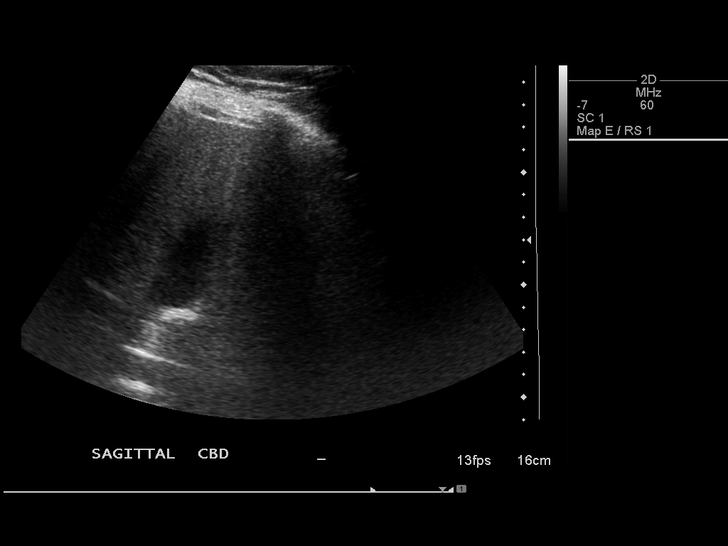

[14 of 25 positions shown; findings below may reference images not displayed]

FINDINGS: Gallbladder:  Echogenic focus in the neck of the gallbladder
compatible with gallstones.  This does not move.  Positive
sonographic Murphy's sign.  Gallbladder wall is 2.5 mm.

Common bile duct:  1.9 mm

Liver:  Echogenic liver without focal liver lesion.

IVC:  Appears normal.

Pancreas:  Not evaluated due to overlying bowel gas

Spleen:  4.7 cm

Right Kidney:  10.0 cm.  Normal

Left Kidney:  10.7 cm.  Normal

Abdominal aorta:  No aneurysm identified.
IMPRESSION: Gallstone is lodged in the neck of the gallbladder.  The patient is
tender over the gallbladder.

Fatty infiltration of the liver is suspected.

## 2015-11-28 ENCOUNTER — Emergency Department (HOSPITAL_COMMUNITY): Admission: EM | Admit: 2015-11-28 | Discharge: 2015-11-28 | Discharge status: Absent without leave | Payer: Self-pay

## 2015-11-28 NOTE — ED Notes (Signed)
CALLED WITH NO ANSWER

## 2015-11-28 NOTE — ED Notes (Signed)
Called multiple times and no answer 

## 2016-05-11 ENCOUNTER — Ambulatory Visit (INDEPENDENT_AMBULATORY_CARE_PROVIDER_SITE_OTHER): Payer: Self-pay | Admitting: Family Medicine

## 2016-05-11 VITALS — BP 118/80 | HR 79 | Temp 98.3°F | Resp 16 | Ht <= 58 in | Wt 175.0 lb

## 2016-05-11 DIAGNOSIS — R0789 Other chest pain: Secondary | ICD-10-CM

## 2016-05-11 DIAGNOSIS — Z789 Other specified health status: Secondary | ICD-10-CM

## 2016-05-11 DIAGNOSIS — N644 Mastodynia: Secondary | ICD-10-CM

## 2016-05-11 DIAGNOSIS — R1011 Right upper quadrant pain: Secondary | ICD-10-CM

## 2016-05-11 DIAGNOSIS — Z23 Encounter for immunization: Secondary | ICD-10-CM

## 2016-05-11 MED ORDER — MELOXICAM 7.5 MG PO TABS: 7.5000 mg | ORAL_TABLET | Freq: Every day | ORAL | 0 refills | Status: DC

## 2016-05-11 NOTE — Patient Instructions (Addendum)
  Vamos a coordinar una cita para mammograia y [place].  Use meloxicam para dolor.  Voy a llamarle para darle sus resultados de pruebas de [place] en oficina.  si no esta mejor en 2 semanas, o empeorse - regrese mas temprano.    Dolor en la pared torcica (Chest Wall Pain) El dolor en la pared torcica se produce en los huesos y los msculos del pecho o alrededor de Scientist, product/process developmentestos. A veces, una lesin Occupational psychologistcausa este dolor. En ocasiones, la causa puede ser desconocida. Este dolor puede durar varias semanas. INSTRUCCIONES PARA EL CUIDADO EN EL HOGAR Est atento a cualquier cambio en los sntomas. Tome estas medidas para Acupuncturistaliviar el dolor:  Haga reposo como se lo haya indicado el mdico.  Evite las actividades que causan dolor. Estas pueden ser Charles Schwabaquellas que requieren el uso de los msculos del trax, los abdominales o los laterales para levantar objetos pesados.  Si se lo indican, aplique hielo sobre la zona dolorida:  Ponga el hielo en una bolsa plstica.  Coloque una toalla entre la piel y la bolsa de hielo.  Coloque el hielo durante 20minutos, 2 a 3veces por Futures traderda.  Tome los medicamentos de venta libre y los recetados solamente como se lo haya indicado el mdico.  No consuma productos que contengan tabaco, incluidos cigarrillos, tabaco de Theatre managermascar y Administrator, Civil Servicecigarrillos electrnicos. Si necesita ayuda para dejar de fumar, consulte al mdico.  Concurra a todas las visitas de control como se lo haya indicado el mdico. Esto es importante. SOLICITE ATENCIN MDICA SI:  Lance Mussiene fiebre.  El dolor de [place] empeora.  Aparecen nuevos sntomas. SOLICITE ATENCIN MDICA DE INMEDIATO SI:  Tiene nuseas o vmitos.  Berenice Primasranspira o tiene sensacin de desvanecimiento.  Tiene tos con flema (esputo) o expectora sangre al toser.  Le falta el aire. Esta informacin no tiene Theme park managercomo fin reemplazar el consejo del mdico. Asegrese de hacerle al mdico cualquier pregunta que tenga. Document Released: 07/13/2006 Document  Revised: 02/20/2015 Document Reviewed: 08/27/2014 Elsevier Interactive Patient Education  2017 [employer]    IF you received an x-ray today, you will receive an invoice from [facility]. Please contact [facility] at [phone] with questions or concerns regarding your invoice.   IF you received labwork today, you will receive an invoice from [facility]. Please contact [facility] at [phone] with questions or concerns regarding your invoice.   Our billing staff will not be able to assist you with questions regarding bills from these companies.  You will be contacted with the lab results as soon as they are available. The fastest way to get your results is to activate your My Chart account. Instructions are located on the last page of this paperwork. If you have not heard from us regarding the results in 2 weeks, please contact this office.

## 2016-05-11 NOTE — Progress Notes (Signed)
Subjective:  By signing my name below, I, Bonnie Palmer, attest that this documentation has been prepared under the direction and in the presence of Bonnie FloodJeffrey R Nyimah Shadduck, MD Electronically Signed: Charline BillsEssence Palmer, ED Scribe 05/11/2016 at 4:32 PM.   Patient ID: Bonnie Palmer, female    DOB: 09/07/1970, 45 y.o.   MRN: [id number]  Chief Complaint  Patient presents with  . pain right side under breast area    x 2 days   . Immunizations    flu   HPI HPI Comments: Bonnie Palmer is a 45 y.o. female who presents to the [facility] complaining of intermittent right-sided chest wall pain just underneath the breast for the past 6 months. Pt describes pain as a burning sensation that is reproducible with palpating the chest wall. Pt states that pain can last for hours and self-resolves. She states that some days she has pain and some days she does not. Pt states that pain has persisted for the past 4 days now. She has tried flanax in the past with relief. Pt denies fever, nasuea, vomiting, sob, rash or skin changes. Pt has not had a mammogram. No h/o breast CA or breast issues in the family. Pshx includes cholecystectomy.  There are no active problems to display for this patient.  Past Medical History:  Diagnosis Date  . No pertinent past medical history    Past Surgical History:  Procedure Laterality Date  . CHOLECYSTECTOMY  05/18/2012   Procedure: LAPAROSCOPIC CHOLECYSTECTOMY;  Surgeon: Almond LintFaera Byerly, MD;  Location: [facility] OR;  Service: General;  Laterality: N/A;  . NO PAST SURGERIES     No Known Allergies Prior to Admission medications   Not on File   Social History   Social History  . Marital status: Single    Spouse name: N/A  . Number of children: N/A  . Years of education: N/A   Occupational History  . Not on file.   Social History Main Topics  . Smoking status: Current Some Day Smoker    Types: Cigarettes  . Smokeless tobacco: Never Used  . Alcohol use No    . Drug use: No  . Sexual activity: Not on file   Other Topics Concern  . Not on file   Social History Narrative  . No narrative on file   Review of Systems  Constitutional: Negative for fever.  Respiratory: Negative for shortness of breath.   Cardiovascular:       +chest wall/breast pain  Gastrointestinal: Negative for nausea and vomiting.  Skin: Negative for rash.      Objective:   Physical Exam  Constitutional: She is oriented to person, place, and time. She appears well-developed and well-nourished. No distress.  HENT:  Head: Normocephalic and atraumatic.  Eyes: Conjunctivae and EOM are normal.  Neck: Neck supple. No tracheal deviation present.  Cardiovascular: Normal rate.   Pulmonary/Chest: Effort normal. No respiratory distress.  Abdominal: There is tenderness in the right upper quadrant.  Genitourinary: No breast discharge.  Genitourinary Comments: Tender along upper lateral R breast, inferior aspect as well as into the chest wall. No skin changes of the breast. No redness. No discharge from the nipple. Tender in the axilla but no adenopathy palpated.  L breast: slightly tender along the lateral aspect into the axilla only. Skin is intact. No redness or discharge from the nippple. No adenopathy.  Musculoskeletal: Normal range of motion.  Neurological: She is alert and oriented to person, place, and time.  Skin:  Skin is warm and dry.  Psychiatric: She has a normal mood and affect. Her behavior is normal.  Nursing note and vitals reviewed.  Vitals:   05/11/16 1532  BP: 118/80  Pulse: 79  Resp: 16  Temp: 98.3 F (36.8 C)  TempSrc: Oral  SpO2: 97%  Weight: 175 lb (79.4 kg)  Height: 4\' 9"  (1.448 m)      Assessment & Plan:    Bonnie Palmer is a 45 y.o. female Needs flu shot - Plan: Flu Vaccine QUAD 36+ mos PF IM (Fluarix & Fluzone Quad PF)  Breast pain - Plan: MM Digital Diagnostic Bilat, US BREAST COMPLETE UNI LEFT INC AXILLA, US BREAST COMPLETE UNI  RIGHT INC AXILLA, COMPLETE METABOLIC PANEL WITH GFR  Chest wall pain - Plan: meloxicam (MOBIC) 7.5 MG tablet  RUQ abdominal pain - Plan: CBC, COMPLETE METABOLIC PANEL WITH GFR  Language barrier   Multiple areas of pain identified but includes bilateral breasts with self reports of nodules at home, did not appreciate significant nodularity on exam.  - Will check breast ultrasound, mammogram to evaluate breast pain further. Does also have some chest wall pain, trial of meloxicam 7.5 mg daily for that pain.  -  Reports of right upper quadrant abdominal pain as well as some discomfort on exam, but afebrile.  Will also check CMP and CBC, but ultrasound may be needed if this persists.   Spanish spoken, all questions answered and understanding expressed.  Meds ordered this encounter  Medications  . meloxicam (MOBIC) 7.5 MG tablet    Sig: Take 1 tablet (7.5 mg total) by mouth daily.    Dispense:  30 tablet    Refill:  0   Patient Instructions    Vamos a coordinar una cita para mammograia y [place].  Use meloxicam para dolor.  Voy a llamarle para darle sus resultados de pruebas de [place] en oficina.  si no esta mejor en 2 semanas, o empeorse - regrese mas temprano.    Dolor en la pared torcica (Chest Wall Pain) El dolor en la pared torcica se produce en los huesos y los msculos del pecho o alrededor de Scientist, product/process development. A veces, una lesin Occupational psychologist. En ocasiones, la causa puede ser desconocida. Este dolor puede durar varias semanas. INSTRUCCIONES PARA EL CUIDADO EN EL HOGAR Est atento a cualquier cambio en los sntomas. Tome estas medidas para Acupuncturist dolor:  Haga reposo como se lo haya indicado el mdico.  Evite las actividades que causan dolor. Estas pueden ser [employer] requieren el uso de los msculos del trax, los abdominales o los laterales para levantar objetos pesados.  Si se lo indican, aplique hielo sobre la zona dolorida:  Ponga el hielo en una bolsa  plstica.  Coloque una toalla entre la piel y la bolsa de hielo.  Coloque el hielo durante , 2 a 3veces por Futures trader.  Tome los medicamentos de venta libre y los recetados solamente como se lo haya indicado el mdico.  No consuma productos que contengan tabaco, incluidos cigarrillos, tabaco de Theatre manager y Administrator, Civil Service. Si necesita ayuda para dejar de fumar, consulte al mdico.  Concurra a todas las visitas de control como se lo haya indicado el mdico. Esto es importante. SOLICITE ATENCIN MDICA SI:  Lance Muss.  El dolor de [place].  Aparecen nuevos sntomas. SOLICITE ATENCIN MDICA DE INMEDIATO SI:  Tiene nuseas o vmitos.  Berenice Primas o tiene sensacin de desvanecimiento.  Tiene tos con flema (esputo) o expectora sangre al  toser.  Le falta el aire. Esta informacin no tiene Theme park managercomo fin reemplazar el consejo del mdico. Asegrese de hacerle al mdico cualquier pregunta que tenga. Document Released: 07/13/2006 Document Revised: 02/20/2015 Document Reviewed: 08/27/2014 Elsevier Interactive Patient Education  2017 [employer].    IF you received an x-ray today, you will receive an invoice from [facility]. Please contact [facility] at [phone] with questions or concerns regarding your invoice.   IF you received labwork today, you will receive an invoice from [facility]. Please contact [facility] at [phone] with questions or concerns regarding your invoice.   Our billing staff will not be able to assist you with questions regarding bills from these companies.  You will be contacted with the lab results as soon as they are available. The fastest way to get your results is to activate your My Chart account. Instructions are located on the last page of this paperwork. If you have not heard from us regarding the results in 2 weeks, please contact this office.        I personally performed the services  described in this documentation, which was scribed in my presence. The recorded information has been reviewed and considered, and addended by me as needed.   Signed,   Meredith StaggersJeffrey Sena Hoopingarner, MD [facility].  05/13/16 3:05 PM

## 2016-11-18 ENCOUNTER — Ambulatory Visit (INDEPENDENT_AMBULATORY_CARE_PROVIDER_SITE_OTHER): Payer: Self-pay | Admitting: Physician Assistant

## 2016-11-18 ENCOUNTER — Encounter: Payer: Self-pay | Admitting: Physician Assistant

## 2016-11-18 VITALS — BP 130/74 | HR 70 | Temp 98.2°F | Resp 18 | Ht <= 58 in | Wt 161.2 lb

## 2016-11-18 DIAGNOSIS — R0789 Other chest pain: Secondary | ICD-10-CM

## 2016-11-18 DIAGNOSIS — R079 Chest pain, unspecified: Secondary | ICD-10-CM

## 2016-11-18 DIAGNOSIS — F17200 Nicotine dependence, unspecified, uncomplicated: Secondary | ICD-10-CM | POA: Insufficient documentation

## 2016-11-18 MED ORDER — MELOXICAM 7.5 MG PO TABS: 7.5000 mg | ORAL_TABLET | Freq: Every day | ORAL | 0 refills | Status: DC

## 2016-11-18 NOTE — Patient Instructions (Addendum)
Costochondritis Costochondritis is swelling and irritation (inflammation) of the tissue (cartilage) that connects your ribs to your breastbone (sternum). This causes pain in the front of your chest. Usually, the pain:  Starts gradually.  Is in more than one rib.  This condition usually goes away on its own over time. Follow these instructions at home:  Do not do anything that makes your pain worse.  If directed, put ice on the painful area: ? Put ice in a plastic bag. ? Place a towel between your skin and the bag. ? Leave the ice on for 20 minutes, 2-3 times a day.  If directed, put heat on the affected area as often as told by your doctor. Use the heat source that your doctor tells you to use, such as a moist heat pack or a heating pad. ? Place a towel between your skin and the heat source. ? Leave the heat on for 20-30 minutes. ? Take off the heat if your skin turns bright red. This is very important if you cannot feel pain, heat, or cold. You may have a greater risk of getting burned.  Take over-the-counter and prescription medicines only as told by your doctor.  Return to your normal activities as told by your doctor. Ask your doctor what activities are safe for you.  Keep all follow-up visits as told by your doctor. This is important. Contact a doctor if:  You have chills or a fever.  Your pain does not go away or it gets worse.  You have a cough that does not go away. Get help right away if:  You are short of breath. This information is not intended to replace advice given to you by your health care provider. Make sure you discuss any questions you have with your health care provider. Document Released: 11/18/2007 Document Revised: 12/20/2015 Document Reviewed: 09/25/2015 Elsevier Interactive Patient Education  2018 [employer].     IF you received an x-ray today, you will receive an invoice from [facility]. Please contact [facility] at  [phone] with questions or concerns regarding your invoice.   IF you received labwork today, you will receive an invoice from [facility]. Please contact [facility] at [phone] with questions or concerns regarding your invoice.   Our billing staff will not be able to assist you with questions regarding bills from these companies.  You will be contacted with the lab results as soon as they are available. The fastest way to get your results is to activate your My Chart account. Instructions are located on the last page of this paperwork. If you have not heard from us regarding the results in 2 weeks, please contact this office.

## 2016-11-18 NOTE — Progress Notes (Signed)
   Bonnie Palmer  MRN: [id number] DOB: [birth year]  PCP: Default, Provider, MD  Chief Complaint  Patient presents with  . Chest Pain    felt likes a stabbing pain like 2 weeks ago and came back today   . Arm Pain    arm numbness on and off both arms     Subjective:  Pt presents to clinic for stinging sensation in her chest - deeper than her skin - she had an episode 2 weeks ago but it resolved quickly that day and then 3 days ago the stinging sensation returned but now she also had pain - worse with deep breathing and she has stopped taking full breaths due to the pain but not with arm movement.  No SOB, wheezing, h/o asthma or lung disease.  Smoker 2-4 cigs a day.  No rash.  No problems with heart burn or indigestion.  She tried flanex which is like a motrin and it helped but she has none today.  When asked about numbness she endorses no numbness in her arms.  Review of Systems  Respiratory: Negative for cough, chest tightness, shortness of breath and wheezing.   Cardiovascular: Positive for chest pain. Negative for palpitations.    Patient Active Problem List   Diagnosis Date Noted  . Smoker 11/18/2016    No current outpatient prescriptions on file prior to visit.   No current facility-administered medications on file prior to visit.     No Known Allergies  Pt patients past, family and social history were reviewed and updated.   Objective:  BP 130/74 (BP Location: Left Arm, Patient Position: Sitting, Cuff Size: Normal)   Pulse 70   Temp 98.2 F (36.8 C) (Oral)   Resp 18   Ht 4\' 9"  (1.448 m)   Wt 161 lb 3.2 oz (73.1 kg)   SpO2 98%   BMI 34.88 kg/m   Physical Exam  Constitutional: She is oriented to person, place, and time and well-developed, well-nourished, and in no distress.  HENT:  Head: Normocephalic and atraumatic.  Right Ear: Hearing and external ear normal.  Left Ear: Hearing and external ear normal.  Eyes: Conjunctivae are normal.  Neck: Normal range  of motion.  Cardiovascular: Normal rate, regular rhythm and normal heart sounds.   No murmur heard. Pulmonary/Chest: Effort normal and breath sounds normal. She has no wheezes.    Neurological: She is alert and oriented to person, place, and time. Gait normal.  Skin: Skin is warm and dry.  Psychiatric: Mood, memory, affect and judgment normal.  Vitals reviewed.  Rhythm: NSR at a rate of 67. Findings: normal Last EKG: none  I have personally reviewed the EKG tracing and agree with the computerized printout.  Assessment and Plan :  Chest pain, unspecified type - Plan: EKG 12-Lead, Care order/instruction:  Smoker  Chest wall pain - Plan: meloxicam (MOBIC) 7.5 MG tablet - d/w pt that ice and rest can help with this but she should still continue to use and move her arms and take deep breaths to prevent further problems.    Benny LennertSarah Binnie Vonderhaar PA-C  [facility] at [facility] 11/18/2016 6:08 PM

## 2017-01-06 ENCOUNTER — Ambulatory Visit (INDEPENDENT_AMBULATORY_CARE_PROVIDER_SITE_OTHER): Payer: Self-pay | Admitting: Urgent Care

## 2017-01-06 ENCOUNTER — Encounter: Payer: Self-pay | Admitting: Urgent Care

## 2017-01-06 VITALS — BP 117/81 | HR 75 | Temp 98.8°F | Resp 17 | Ht <= 58 in | Wt 162.0 lb

## 2017-01-06 DIAGNOSIS — Z833 Family history of diabetes mellitus: Secondary | ICD-10-CM

## 2017-01-06 DIAGNOSIS — E66812 Obesity, class 2: Secondary | ICD-10-CM

## 2017-01-06 DIAGNOSIS — R200 Anesthesia of skin: Secondary | ICD-10-CM

## 2017-01-06 DIAGNOSIS — IMO0001 Reserved for inherently not codable concepts without codable children: Secondary | ICD-10-CM

## 2017-01-06 DIAGNOSIS — E669 Obesity, unspecified: Secondary | ICD-10-CM

## 2017-01-06 DIAGNOSIS — R202 Paresthesia of skin: Secondary | ICD-10-CM

## 2017-01-06 DIAGNOSIS — Z6837 Body mass index (BMI) 37.0-37.9, adult: Secondary | ICD-10-CM

## 2017-01-06 DIAGNOSIS — J029 Acute pharyngitis, unspecified: Secondary | ICD-10-CM

## 2017-01-06 DIAGNOSIS — E1165 Type 2 diabetes mellitus with hyperglycemia: Secondary | ICD-10-CM

## 2017-01-06 LAB — POCT RAPID STREP A (OFFICE): Rapid Strep A Screen: NEGATIVE

## 2017-01-06 LAB — POCT GLYCOSYLATED HEMOGLOBIN (HGB A1C): Hemoglobin A1C: 11.6

## 2017-01-06 MED ORDER — METFORMIN HCL 1000 MG PO TABS: 1000.0000 mg | ORAL_TABLET | Freq: Two times a day (BID) | ORAL | 3 refills | Status: DC

## 2017-01-06 MED ORDER — LISINOPRIL 5 MG PO TABS: 5.0000 mg | ORAL_TABLET | Freq: Every day | ORAL | 3 refills | Status: DC

## 2017-01-06 MED ORDER — ATORVASTATIN CALCIUM 20 MG PO TABS: 20.0000 mg | ORAL_TABLET | Freq: Every day | ORAL | 1 refills | Status: DC

## 2017-01-06 MED ORDER — GLIPIZIDE 5 MG PO TABS: 5.0000 mg | ORAL_TABLET | Freq: Two times a day (BID) | ORAL | 3 refills | Status: DC

## 2017-01-06 NOTE — Progress Notes (Signed)
   MRN: [id number] DOB: 02-11-1971  Subjective:   Bonnie Palmer is a 46 y.o. female presenting for sore throat.  Reports 3 day history of sore throat, throat congestion, subjective fever, itchy eyes. She also felt nausea and upset stomach this morning. Has had tingling of her hands and feet bilaterally. She has taken XL-3, she received an injection of penicillin by a family member. Denies sinus pain, sinus congestion, ear pain, ear drainage, cough, chest pain, shob, wheezing, abdominal pain, rashes. Smokes 2 cigarettes per day, has 6 pack over the weekends.   Bonnie Palmer currently has no medications in their medication list. Also has No Known Allergies.  Bonnie Palmer  has a past medical history of Fatty liver; Hyperlipidemia; and Pre-diabetes. Also  has a past surgical history that includes Cholecystectomy (05/18/2012). Her family history includes Diabetes in her brother and mother.  Objective:   Vitals: BP 117/81   Pulse 75   Temp 98.8 F (37.1 C) (Oral)   Resp 17   Ht 4\' 7"  (1.397 m)   Wt 162 lb (73.5 kg)   LMP 12/26/2016 (Approximate)   SpO2 98%   BMI 37.65 kg/m   Physical Exam  Constitutional: She is oriented to person, place, and time. She appears well-developed and well-nourished.  HENT:  TM's intact bilaterally, no effusions or erythema. Nasal turbinates pink and moist, nasal passages patent. No sinus tenderness. Oropharynx clear, mucous membranes moist.  Eyes: Right conjunctiva is not injected. Right conjunctiva has no hemorrhage. Left conjunctiva is not injected. Left conjunctiva has no hemorrhage. No scleral icterus.  Cardiovascular: Normal rate, regular rhythm and intact distal pulses.  Exam reveals no gallop and no friction rub.   No murmur heard. Pulmonary/Chest: No respiratory distress. She has no wheezes. She has no rales.  Musculoskeletal: She exhibits no edema.  Neurological: She is alert and oriented to person, place, and time.  Skin: Skin is warm and dry.  Psychiatric:  She has a normal mood and affect.   Results for orders placed or performed in visit on 01/06/17 (from the past 24 hour(s))  POCT glycosylated hemoglobin (Hb A1C)     Status: None   Collection Time: 01/06/17  4:24 PM  Result Value Ref Range   Hemoglobin A1C 11.6   POCT rapid strep A     Status: None   Collection Time: 01/06/17  4:38 PM  Result Value Ref Range   Rapid Strep A Screen Negative Negative   Assessment and Plan :   1. Sore throat - Advised supportive care. RTC in 1 week if symptoms persist.   2. Uncontrolled type 2 diabetes mellitus without complication, without long-term current use of insulin (HCC) 3. Class 2 obesity without serious comorbidity with body mass index (BMI) of 37.0 to 37.9 in adult, unspecified obesity type 4. Numbness and tingling in both hands 5. Numbness and tingling of both feet 6. Family history of diabetes mellitus - Counseled extensively on diabetes diagnosis. Labs pending. Recommended lifestyle modifications. Start Metformin and glipizide. Titration instructions reviewed. Recheck in 4 weeks.  Wallis BambergMario Briyah Wheelwright, PA-C [facility] [phone] 01/06/2017 3:51 PM

## 2017-01-06 NOTE — Patient Instructions (Addendum)
Metformina (tome con comida) Primera Semana - tome 1/2 pastilla dos veces diaramente Segunda Semana - tome 1 pastilla en la manana y 1/2 pastilla por la noche Scientific laboratory technicianTersera Semana (y adelante) - tome 1 pastilla dos veces diaramente   Glipizide Primera Semana - tome 1 pastilla diaramente Segunda Semana - tome 1 pastilla do veces diaramente   La diabetes mellitus y los alimentos (Diabetes Mellitus and Food) Es importante que controle su nivel de azcar en la sangre (glucosa). El nivel de glucosa en sangre depende en gran medida de lo que usted come. Comer alimentos saludables en las cantidades [place]adecuadas a lo largo del Futures traderda, aproximadamente a la misma hora CarMaxtodos los das, lo ayudar a Chief Operating Officercontrolar su nivel de Event organiserglucosa en sangre. Tambin puede ayudarlo a retrasar o Fish farm managerevitar el empeoramiento de la diabetes mellitus. Comer de Regions Financial Corporationmanera saludable incluso puede ayudarlo a Event organisermejorar el nivel de presin arterial y a Baristaalcanzar o Pharmacologistmantener un peso saludable. Entre las recomendaciones generales para alimentarse y Water quality scientistcocinar los alimentos de forma saludable, se incluyen las siguientes:  Respetar las comidas principales y comer colaciones con regularidad. Evitar pasar largos perodos sin comer con el fin de perder peso.  Seguir una dieta que consista principalmente en alimentos de origen vegetal, como frutas, vegetales, frutos secos, legumbres y cereales integrales.  Utilizar mtodos de coccin a baja temperatura, como hornear, en lugar de mtodos de coccin a alta temperatura, como frer en abundante aceite. Trabaje con el nutricionista para aprender a Acupuncturistusar la informacin nutricional de las etiquetas de los alimentos. CMO PUEDEN AFECTARME LOS ALIMENTOS? Carbohidratos Los carbohidratos afectan el nivel de glucosa en sangre ms que cualquier otro tipo de alimento. El nutricionista lo ayudar a Chief Strategy Officerdeterminar cuntos carbohidratos puede consumir en cada comida y ensearle a contarlos. El recuento de carbohidratos es importante para  mantener la glucosa en sangre en un nivel saludable, en especial si utiliza insulina o toma determinados medicamentos para la diabetes mellitus. Alcohol El alcohol puede provocar disminuciones sbitas de la glucosa en sangre (hipoglucemia), en especial si utiliza insulina o toma determinados medicamentos para la diabetes mellitus. La hipoglucemia es una afeccin que puede poner en peligro la vida. Los sntomas de la hipoglucemia (somnolencia, mareos y Administratordesorientacin) son similares a los sntomas de haber consumido mucho alcohol. Si el mdico lo autoriza a beber alcohol, hgalo con moderacin y siga estas pautas:  Las mujeres no deben beber ms de un trago por da, y los hombres no deben beber ms de dos tragos por Futures traderda. Un trago es igual a: ? 12 onzas (355 ml) de cerveza ? 5 onzas de vino (150 ml) de vino ? 1,5onzas (45ml) de bebidas espirituosas  No beba con el estmago vaco.  Mantngase hidratado. Beba agua, gaseosas dietticas o t helado sin azcar.  Las gaseosas comunes, los jugos y otros refrescos podran contener muchos carbohidratos y se Heritage managerdeben contar. QU ALIMENTOS NO SE RECOMIENDAN? Cuando haga las elecciones de alimentos, es importante que recuerde que todos los alimentos son distintos. Algunos tienen menos nutrientes que otros por porcin, aunque podran tener la misma cantidad de caloras o carbohidratos. Es difcil darle al cuerpo lo que necesita cuando consume alimentos con menos nutrientes. Estos son algunos ejemplos de alimentos que debera evitar ya que contienen muchas caloras y carbohidratos, pero pocos nutrientes:  NeurosurgeonGrasas trans (la mayora de los alimentos procesados incluyen grasas trans en la etiqueta de Informacin nutricional).  Gaseosas comunes.  Jugos.  Caramelos.  Dulces, como tortas, pasteles, rosquillas y [place]kow[place] Cornergalletas.  Comidas fritas. QU  ALIMENTOS PUEDO COMER? Consuma alimentos ricos en nutrientes, que nutrirn el cuerpo y lo mantendrn saludable. Los  alimentos que debe comer tambin dependern de varios factores, como:  Las caloras que necesita.  Los medicamentos que toma.  Su peso.  El nivel de glucosa en Enderssangre.  El Hixtonnivel de presin arterial.  El nivel de colesterol. Debe consumir una amplia variedad de alimentos, por ejemplo:  Protenas. ? Cortes de Target Corporationcarne magros. ? Protenas con bajo contenido de grasas saturadas, como pescado, clara de huevo y frijoles. Evite las carnes procesadas.  Frutas y vegetales. ? Christmas IslandFrutas y Sports administratorvegetales que pueden ayudar a AGCO Corporationcontrolar los niveles sanguneos de Erieglucosa, como Cobremanzanas, mangos y batatas.  Productos lcteos. ? Elija productos lcteos sin grasa o con bajo contenido de [place]grasa, como Columbialeche, yogur y [place] Runqueso.  Cereales, panes, pastas y arroz. ? Elija cereales integrales, como panes multicereales, avena en grano y arroz integral. Estos alimentos pueden ayudar a controlar la presin arterial.  Rosalin HawkingGrasas. ? Alimentos que contengan grasas saludables, como frutos secos, Chartered certified accountantaguacate, aceite de Chandleroliva, aceite de canola y pescado. TODOS LOS QUE PADECEN DIABETES MELLITUS TIENEN EL MISMO PLAN DE COMIDAS? Dado que todas las personas que padecen diabetes mellitus son distintas, no hay un solo plan de comidas que funcione para todos. Es muy importante que se rena con un nutricionista que lo ayudar a crear un plan de comidas adecuado para usted. Esta informacin no tiene Theme park managercomo fin reemplazar el consejo del mdico. Asegrese de hacerle al mdico cualquier pregunta que tenga. Document Released: 09/08/2007 Document Revised: 06/22/2014 Document Reviewed: 04/28/2013 Elsevier Interactive Patient Education  2017 [employer]     IF you received an x-ray today, you will receive an invoice from [facility]. Please contact [facility] at [phone] with questions or concerns regarding your invoice.   IF you received labwork today, you will receive an invoice from [facility]. Please contact [facility] at  [phone] with questions or concerns regarding your invoice.   Our billing staff will not be able to assist you with questions regarding bills from these companies.  You will be contacted with the lab results as soon as they are available. The fastest way to get your results is to activate your My Chart account. Instructions are located on the last page of this paperwork. If you have not heard from us regarding the results in 2 weeks, please contact this office.

## 2017-01-07 LAB — COMPREHENSIVE METABOLIC PANEL
ALBUMIN: 4.2 g/dL (ref 3.5–5.5)
ALK PHOS: 147 IU/L — AB (ref 39–117)
ALT: 464 IU/L — ABNORMAL HIGH (ref 0–32)
AST: 352 IU/L — AB (ref 0–40)
Albumin/Globulin Ratio: 1.2 (ref 1.2–2.2)
BILIRUBIN TOTAL: 0.3 mg/dL (ref 0.0–1.2)
BUN / CREAT RATIO: 16 (ref 9–23)
BUN: 8 mg/dL (ref 6–24)
CHLORIDE: 96 mmol/L (ref 96–106)
CO2: 23 mmol/L (ref 20–29)
Calcium: 9.4 mg/dL (ref 8.7–10.2)
Creatinine, Ser: 0.5 mg/dL — ABNORMAL LOW (ref 0.57–1.00)
GFR calc Af Amer: 135 mL/min/{1.73_m2} (ref >59)
GFR calc non Af Amer: 117 mL/min/{1.73_m2} (ref >59)
GLOBULIN, TOTAL: 3.6 g/dL (ref 1.5–4.5)
Glucose: 307 mg/dL — ABNORMAL HIGH (ref 65–99)
Potassium: 4.8 mmol/L (ref 3.5–5.2)
SODIUM: 132 mmol/L — AB (ref 134–144)
Total Protein: 7.8 g/dL (ref 6.0–8.5)

## 2017-01-07 NOTE — Addendum Note (Signed)
Addended by: Baldwin CrownJOHNSON, SHAQUETTA D on: 01/07/2017 03:03 PM   Modules accepted: Orders

## 2017-01-08 LAB — HEPATITIS C ANTIBODY: Hep C Virus Ab: <0.1 s/co ratio (ref 0.0–0.9)

## 2017-01-08 LAB — HEPATITIS B SURFACE ANTIBODY, QUANTITATIVE: Hepatitis B Surf Ab Quant: <3.1 m[IU]/mL — ABNORMAL LOW (ref >9.9)

## 2017-01-11 ENCOUNTER — Ambulatory Visit (INDEPENDENT_AMBULATORY_CARE_PROVIDER_SITE_OTHER): Payer: Self-pay | Admitting: Urgent Care

## 2017-01-11 ENCOUNTER — Encounter: Payer: Self-pay | Admitting: Urgent Care

## 2017-01-11 VITALS — BP 114/78 | HR 69 | Temp 98.3°F | Resp 18 | Ht <= 58 in | Wt 159.0 lb

## 2017-01-11 DIAGNOSIS — R1011 Right upper quadrant pain: Secondary | ICD-10-CM

## 2017-01-11 DIAGNOSIS — K760 Fatty (change of) liver, not elsewhere classified: Secondary | ICD-10-CM

## 2017-01-11 DIAGNOSIS — E669 Obesity, unspecified: Secondary | ICD-10-CM

## 2017-01-11 DIAGNOSIS — Z6837 Body mass index (BMI) 37.0-37.9, adult: Secondary | ICD-10-CM

## 2017-01-11 DIAGNOSIS — IMO0001 Reserved for inherently not codable concepts without codable children: Secondary | ICD-10-CM

## 2017-01-11 DIAGNOSIS — R7989 Other specified abnormal findings of blood chemistry: Secondary | ICD-10-CM

## 2017-01-11 DIAGNOSIS — E66812 Obesity, class 2: Secondary | ICD-10-CM

## 2017-01-11 DIAGNOSIS — E1165 Type 2 diabetes mellitus with hyperglycemia: Secondary | ICD-10-CM

## 2017-01-11 DIAGNOSIS — R945 Abnormal results of liver function studies: Secondary | ICD-10-CM

## 2017-01-11 MED ORDER — BLOOD GLUCOSE MONITOR KIT: PACK | 0 refills | Status: AC

## 2017-01-11 NOTE — Progress Notes (Signed)
   MRN: [id number]  Subjective:   Bonnie Palmer is a 46 y.o. female who presents for follow up of new onset Type 2 Diabetes Mellitus.   Patient is currently managed with metformin and glipizide. Admits occasional mid RUQ belly pain. Patient denies blurred vision, polydipsia, chest pain, nausea, vomiting, hematuria, polyuria, skin infections, numbness or tingling. Diet is unhealthy, does not exercise, drinks 6-12 pack of beer on the weekends. Patient quit smoking last week.  Known diabetic complications: none  Bonnie Palmer has a current medication list which includes the following prescription(s): atorvastatin, glipizide, lisinopril, and metformin. Also has No Known Allergies.  Bonnie Palmer  has a past medical history of Fatty liver; Hyperlipidemia; and Pre-diabetes. Also  has a past surgical history that includes Cholecystectomy (05/18/2012).  Objective:   PHYSICAL EXAM BP 114/78   Pulse 69   Temp 98.3 F (36.8 C) (Oral)   Resp 18   Ht 4' 8.69" (1.44 m)   Wt 159 lb (72.1 kg)   LMP 12/26/2016 (Approximate)   SpO2 96%   BMI 34.78 kg/m   Physical Exam  Constitutional: She is oriented to person, place, and time. She appears well-developed and well-nourished.  HENT:  Mouth/Throat: Oropharynx is clear and moist.  Eyes: No scleral icterus.  Cardiovascular: Normal rate, regular rhythm and intact distal pulses.  Exam reveals no gallop and no friction rub.   No murmur heard. Pulmonary/Chest: No respiratory distress. She has no wheezes. She has no rales.  Neurological: She is alert and oriented to person, place, and time.  Skin: Skin is warm and dry.  Psychiatric: She has a normal mood and affect.   Assessment and Plan :   1. Uncontrolled type 2 diabetes mellitus without complication, without long-term current use of insulin (HCC) 2. Class 2 obesity without serious comorbidity with body mass index (BMI) of 37.0 to 37.9 in adult, unspecified obesity type 3. Elevated LFTs 4. RUQ pain 5. Fatty  liver - Reviewed labs, will have patient take 1/2 metformin dose initially prescribed. Will also have patient take glipizide once daily. Counseled on dietary modifications. Start exercise. Check fasting blood sugar daily. Rtc in 4 weeks for recheck. Patient refused insulin for now. I advised that we strongly consider this if her blood sugars remain high.  Wallis BambergMario Lumir Demetriou, PA-C [facility] [phone] 01/11/2017 8:21 AM

## 2017-01-11 NOTE — Patient Instructions (Addendum)
Metformin Tome 1/2 pastilla (500mg ) dos veces diaramente.  Glipizide Tome 1 pastilla diaramente.   La diabetes mellitus y los alimentos (Diabetes Mellitus and Food) Es importante que controle su nivel de azcar en la sangre (glucosa). El nivel de glucosa en sangre depende en gran medida de lo que usted come. [place] alimentos saludables en las cantidades [place]adecuadas a lo largo del Futures traderda, aproximadamente a la misma hora CarMaxtodos los das, lo ayudar a Chief Operating Officercontrolar su nivel de Event organiserglucosa en sangre. Tambin puede ayudarlo a retrasar o Fish farm managerevitar el empeoramiento de la diabetes mellitus. Comer de Regions Financial Corporationmanera saludable incluso puede ayudarlo a Event organisermejorar el nivel de presin arterial y a Baristaalcanzar o Pharmacologistmantener un peso saludable. Entre las recomendaciones generales para alimentarse y Water quality scientistcocinar los alimentos de forma saludable, se incluyen las siguientes:  Respetar las comidas principales y comer colaciones con regularidad. Evitar pasar largos perodos sin comer con el fin de perder peso.  Seguir una dieta que consista principalmente en alimentos de origen vegetal, como frutas, vegetales, frutos secos, legumbres y cereales integrales.  Utilizar mtodos de coccin a baja temperatura, como hornear, en lugar de mtodos de coccin a alta temperatura, como frer en abundante aceite. Trabaje con el nutricionista para aprender a Acupuncturistusar la informacin nutricional de las etiquetas de los alimentos. CMO PUEDEN AFECTARME LOS ALIMENTOS? Carbohidratos Los carbohidratos afectan el nivel de glucosa en sangre ms que cualquier otro tipo de alimento. El nutricionista lo ayudar a Chief Strategy Officerdeterminar cuntos carbohidratos puede consumir en cada comida y ensearle a contarlos. El recuento de carbohidratos es importante para mantener la glucosa en sangre en un nivel saludable, en especial si utiliza insulina o toma determinados medicamentos para la diabetes mellitus. Alcohol El alcohol puede provocar disminuciones sbitas de la glucosa en sangre (hipoglucemia),  en especial si utiliza insulina o toma determinados medicamentos para la diabetes mellitus. La hipoglucemia es una afeccin que puede poner en peligro la vida. Los sntomas de la hipoglucemia (somnolencia, mareos y Administratordesorientacin) son similares a los sntomas de haber consumido mucho alcohol. Si el mdico lo autoriza a beber alcohol, hgalo con moderacin y siga estas pautas:  Las mujeres no deben beber ms de un trago por da, y los hombres no deben beber ms de dos tragos por Futures traderda. Un trago es igual a: ? 12 onzas (355 ml) de cerveza ? 5 onzas de vino (150 ml) de vino ? 1,5onzas (45ml) de bebidas espirituosas  No beba con el estmago vaco.  Mantngase hidratado. Beba agua, gaseosas dietticas o t helado sin azcar.  Las gaseosas comunes, los jugos y otros refrescos podran contener muchos carbohidratos y se Heritage managerdeben contar. QU ALIMENTOS NO SE RECOMIENDAN? Cuando haga las elecciones de alimentos, es importante que recuerde que todos los alimentos son distintos. Algunos tienen menos nutrientes que otros por porcin, aunque podran tener la misma cantidad de caloras o carbohidratos. Es difcil darle al cuerpo lo que necesita cuando consume alimentos con menos nutrientes. Estos son algunos ejemplos de alimentos que debera evitar ya que contienen muchas caloras y carbohidratos, pero pocos nutrientes:  NeurosurgeonGrasas trans (la mayora de los alimentos procesados incluyen grasas trans en la etiqueta de Informacin nutricional).  Gaseosas comunes.  Jugos.  Caramelos.  Dulces, como tortas, pasteles, rosquillas y Huntsvillegalletas.  Comidas fritas. QU ALIMENTOS PUEDO COMER? Consuma alimentos ricos en nutrientes, que nutrirn el cuerpo y lo mantendrn saludable. Los alimentos que debe comer tambin dependern de varios factores, como:  Las caloras que necesita.  Los medicamentos que toma.  Su peso.  El Nianticnivel de  glucosa en sangre.  El Colmannivel de presin arterial.  El nivel de colesterol. Debe  consumir una amplia variedad de alimentos, por ejemplo:  Protenas. ? Cortes de Target Corporationcarne magros. ? Protenas con bajo contenido de grasas saturadas, como pescado, clara de huevo y frijoles. Evite las carnes procesadas.  Frutas y vegetales. ? Christmas IslandFrutas y Sports administratorvegetales que pueden ayudar a AGCO Corporationcontrolar los niveles sanguneos de Lindaleglucosa, como [place] Wood[place]manzanas, mangos y batatas.  Productos lcteos. ? Elija productos lcteos sin grasa o con bajo contenido de Katiegrasa, como [place] Cityleche, yogur y [place]queso.  Cereales, panes, pastas y arroz. ? Elija cereales integrales, como panes multicereales, avena en grano y arroz integral. Estos alimentos pueden ayudar a controlar la presin arterial.  Rosalin HawkingGrasas. ? Alimentos que contengan grasas saludables, como frutos secos, Chartered certified accountantaguacate, aceite de [place]villeoliva, aceite de canola y pescado. TODOS LOS QUE PADECEN DIABETES MELLITUS TIENEN EL MISMO PLAN DE COMIDAS? Dado que todas las personas que padecen diabetes mellitus son distintas, no hay un solo plan de comidas que funcione para todos. Es muy importante que se rena con un nutricionista que lo ayudar a crear un plan de comidas adecuado para usted. Esta informacin no tiene Theme park managercomo fin reemplazar el consejo del mdico. Asegrese de hacerle al mdico cualquier pregunta que tenga. Document Released: 09/08/2007 Document Revised: 06/22/2014 Document Reviewed: 04/28/2013 Elsevier Interactive Patient Education  2017 [employer].     IF you received an x-ray today, you will receive an invoice from [facility]. Please contact [facility] at [phone] with questions or concerns regarding your invoice.   IF you received labwork today, you will receive an invoice from [facility]. Please contact [facility] at [phone] with questions or concerns regarding your invoice.   Our billing staff will not be able to assist you with questions regarding bills from these companies.  You will be contacted with the lab results as soon as they are  available. The fastest way to get your results is to activate your My Chart account. Instructions are located on the last page of this paperwork. If you have not heard from us regarding the results in 2 weeks, please contact this office.

## 2017-02-08 ENCOUNTER — Ambulatory Visit
Admission: RE | Admit: 2017-02-08 | Discharge: 2017-02-08 | Disposition: A | Discharge status: Home / Self Care | Payer: No Typology Code available for payment source | Source: Ambulatory Visit | Attending: Urgent Care | Admitting: Urgent Care

## 2017-02-08 DIAGNOSIS — R945 Abnormal results of liver function studies: Principal | ICD-10-CM

## 2017-02-08 DIAGNOSIS — R1011 Right upper quadrant pain: Secondary | ICD-10-CM

## 2017-02-08 DIAGNOSIS — R7989 Other specified abnormal findings of blood chemistry: Secondary | ICD-10-CM

## 2017-02-19 ENCOUNTER — Ambulatory Visit (INDEPENDENT_AMBULATORY_CARE_PROVIDER_SITE_OTHER): Payer: Self-pay | Admitting: Urgent Care

## 2017-02-19 ENCOUNTER — Encounter: Payer: Self-pay | Admitting: Urgent Care

## 2017-02-19 VITALS — BP 112/69 | HR 78 | Temp 97.9°F | Resp 16 | Ht <= 58 in | Wt 158.4 lb

## 2017-02-19 DIAGNOSIS — E119 Type 2 diabetes mellitus without complications: Secondary | ICD-10-CM

## 2017-02-19 LAB — POCT URINALYSIS DIP (MANUAL ENTRY)
Bilirubin, UA: NEGATIVE
Glucose, UA: NEGATIVE mg/dL
Ketones, POC UA: NEGATIVE mg/dL
Leukocytes, UA: NEGATIVE
NITRITE UA: NEGATIVE
PH UA: 6.5 (ref 5.0–8.0)
PROTEIN UA: NEGATIVE mg/dL
RBC UA: NEGATIVE
Spec Grav, UA: <=1.005 — AB (ref 1.010–1.025)
UROBILINOGEN UA: 0.2 U/dL

## 2017-02-19 LAB — GLUCOSE, POCT (MANUAL RESULT ENTRY): POC GLUCOSE: 65 mg/dL — AB (ref 70–99)

## 2017-02-19 NOTE — Patient Instructions (Addendum)
La diabetes mellitus y los alimentos (Diabetes Mellitus and Food) Es importante que controle su nivel de azcar en la sangre (glucosa). El nivel de glucosa en sangre depende en gran medida de lo que usted come. Comer alimentos saludables en las cantidades adecuadas a lo largo del da, aproximadamente a la misma hora todos los das, lo ayudar a controlar su nivel de glucosa en sangre. Tambin puede ayudarlo a retrasar o evitar el empeoramiento de la diabetes mellitus. Comer de manera saludable incluso puede ayudarlo a mejorar el nivel de presin arterial y a alcanzar o mantener un peso saludable. Entre las recomendaciones generales para alimentarse y cocinar los alimentos de forma saludable, se incluyen las siguientes:  Respetar las comidas principales y comer colaciones con regularidad. Evitar pasar largos perodos sin comer con el fin de perder peso.  Seguir una dieta que consista principalmente en alimentos de origen vegetal, como frutas, vegetales, frutos secos, legumbres y cereales integrales.  Utilizar mtodos de coccin a baja temperatura, como hornear, en lugar de mtodos de coccin a alta temperatura, como frer en abundante aceite. Trabaje con el nutricionista para aprender a usar la informacin nutricional de las etiquetas de los alimentos. CMO PUEDEN AFECTARME LOS ALIMENTOS? Carbohidratos Los carbohidratos afectan el nivel de glucosa en sangre ms que cualquier otro tipo de alimento. El nutricionista lo ayudar a determinar cuntos carbohidratos puede consumir en cada comida y ensearle a contarlos. El recuento de carbohidratos es importante para mantener la glucosa en sangre en un nivel saludable, en especial si utiliza insulina o toma determinados medicamentos para la diabetes mellitus. Alcohol El alcohol puede provocar disminuciones sbitas de la glucosa en sangre (hipoglucemia), en especial si utiliza insulina o toma determinados medicamentos para la diabetes mellitus. La  hipoglucemia es una afeccin que puede poner en peligro la vida. Los sntomas de la hipoglucemia (somnolencia, mareos y desorientacin) son similares a los sntomas de haber consumido mucho alcohol. Si el mdico lo autoriza a beber alcohol, hgalo con moderacin y siga estas pautas:  Las mujeres no deben beber ms de un trago por da, y los hombres no deben beber ms de dos tragos por da. Un trago es igual a: ? 12 onzas (355 ml) de cerveza ? 5 onzas de vino (150 ml) de vino ? 1,5onzas (45ml) de bebidas espirituosas  No beba con el estmago vaco.  Mantngase hidratado. Beba agua, gaseosas dietticas o t helado sin azcar.  Las gaseosas comunes, los jugos y otros refrescos podran contener muchos carbohidratos y se deben contar. QU ALIMENTOS NO SE RECOMIENDAN? Cuando haga las elecciones de alimentos, es importante que recuerde que todos los alimentos son distintos. Algunos tienen menos nutrientes que otros por porcin, aunque podran tener la misma cantidad de caloras o carbohidratos. Es difcil darle al cuerpo lo que necesita cuando consume alimentos con menos nutrientes. Estos son algunos ejemplos de alimentos que debera evitar ya que contienen muchas caloras y carbohidratos, pero pocos nutrientes:  Grasas trans (la mayora de los alimentos procesados incluyen grasas trans en la etiqueta de Informacin nutricional).  Gaseosas comunes.  Jugos.  Caramelos.  Dulces, como tortas, pasteles, rosquillas y galletas.  Comidas fritas. QU ALIMENTOS PUEDO COMER? Consuma alimentos ricos en nutrientes, que nutrirn el cuerpo y lo mantendrn saludable. Los alimentos que debe comer tambin dependern de varios factores, como:  Las caloras que necesita.  Los medicamentos que toma.  Su peso.  El nivel de glucosa en sangre.  El nivel de presin arterial.  El nivel de colesterol. Debe consumir   una amplia variedad de alimentos, por ejemplo:  Protenas. ? Cortes de carne  magros. ? Protenas con bajo contenido de grasas saturadas, como pescado, clara de huevo y frijoles. Evite las carnes procesadas.  Frutas y vegetales. ? Frutas y vegetales que pueden ayudar a controlar los niveles sanguneos de glucosa, como manzanas, mangos y batatas.  Productos lcteos. ? Elija productos lcteos sin grasa o con bajo contenido de grasa, como leche, yogur y queso.  Cereales, panes, pastas y arroz. ? Elija cereales integrales, como panes multicereales, avena en grano y arroz integral. Estos alimentos pueden ayudar a controlar la presin arterial.  Grasas. ? Alimentos que contengan grasas saludables, como frutos secos, aguacate, aceite de oliva, aceite de canola y pescado. TODOS LOS QUE PADECEN DIABETES MELLITUS TIENEN EL MISMO PLAN DE COMIDAS? Dado que todas las personas que padecen diabetes mellitus son distintas, no hay un solo plan de comidas que funcione para todos. Es muy importante que se rena con un nutricionista que lo ayudar a crear un plan de comidas adecuado para usted. Esta informacin no tiene como fin reemplazar el consejo del mdico. Asegrese de hacerle al mdico cualquier pregunta que tenga. Document Released: 09/08/2007 Document Revised: 06/22/2014 Document Reviewed: 04/28/2013 Elsevier Interactive Patient Education  2017 Elsevier Inc.     IF you received an x-ray today, you will receive an invoice from [facility]. Please contact [facility] at [phone] with questions or concerns regarding your invoice.   IF you received labwork today, you will receive an invoice from [facility]. Please contact [facility] at [phone] with questions or concerns regarding your invoice.   Our billing staff will not be able to assist you with questions regarding bills from these companies.  You will be contacted with the lab results as soon as they are available. The fastest way to get your results is to activate your My Chart account. Instructions  are located on the last page of this paperwork. If you have not heard from us regarding the results in 2 weeks, please contact this office.     

## 2017-02-19 NOTE — Progress Notes (Signed)
  MRN: [id number] DOB: 1970-08-17  Subjective:   Bonnie Palmer is a 46 y.o. female presenting for chief complaint of Diabetes; Follow-up; and Medication Refill (has not been taking Lisinopril; wants to discuss; needs refill)  Patient is presenting for follow up on her diabetes. Patient is currently managed with metformin 1/2 tablet per day. Home blood sugar checks are between 90-130's for the past 2 weeks. She has made significant dietary changes, is exercising daily. Patient denies blurred vision, polydipsia, chest pain, nausea, vomiting, abdominal pain, hematuria, polyuria, skin infections, numbness or tingling. Denies smoking cigarettes or drinking alcohol.   Audi has a current medication list which includes the following prescription(s): blood glucose meter kit and supplies, glipizide, metformin, and lisinopril. Also has No Known Allergies.  Bonnie Palmer  has a past medical history of Fatty liver; Hyperlipidemia; and Pre-diabetes. Also  has a past surgical history that includes Cholecystectomy (05/18/2012).  Objective:    Vitals: BP 112/69   Pulse 78   Temp 97.9 F (36.6 C) (Oral)   Resp 16   Ht '4\' 8"'$  (1.422 m)   Wt 158 lb 6.4 oz (71.8 kg)   SpO2 96%   BMI 35.51 kg/m   Physical Exam  Constitutional: She is oriented to person, place, and time. She appears well-developed and well-nourished.  HENT:  Mouth/Throat: Oropharynx is clear and moist.  Eyes: No scleral icterus.  Neck: Normal range of motion. Neck supple. No thyromegaly present.  Cardiovascular: Normal rate, regular rhythm and intact distal pulses.  Exam reveals no gallop and no friction rub.   No murmur heard. Pulmonary/Chest: No respiratory distress. She has no wheezes. She has no rales.  Abdominal: Soft. Bowel sounds are normal. She exhibits no distension and no mass. There is no tenderness. There is no guarding.  Neurological: She is alert and oriented to person, place, and time.  Skin: Skin is warm and dry.   Results for  orders placed or performed in visit on 02/19/17 (from the past 24 hour(s))  POCT urinalysis dipstick     Status: Abnormal   Collection Time: 02/19/17  4:15 PM  Result Value Ref Range   Color, UA yellow yellow   Clarity, UA clear clear   Glucose, UA negative negative mg/dL   Bilirubin, UA negative negative   Ketones, POC UA negative negative mg/dL   Spec Grav, UA <=1.005 (A) 1.010 - 1.025   Blood, UA negative negative   pH, UA 6.5 5.0 - 8.0   Protein Ur, POC negative negative mg/dL   Urobilinogen, UA 0.2 0.2 or 1.0 E.U./dL   Nitrite, UA Negative Negative   Leukocytes, UA Negative Negative  POCT glucose (manual entry)     Status: Abnormal   Collection Time: 02/19/17  4:17 PM  Result Value Ref Range   POC Glucose 65 (A) 70 - 99 mg/dl   Assessment and Plan :   1. Controlled type 2 diabetes mellitus without complication, without long-term current use of insulin (HCC) - Maintain current metformin dosing. Stop glipizide. Patient has made dramatic improvements in her blood sugar. Will repeat a1c and cmet in 2 months. Return-to-clinic precautions discussed, patient verbalized understanding.   [name], PA-C [facility] [phone] 02/19/2017  3:53 PM

## 2017-02-22 ENCOUNTER — Ambulatory Visit
Admission: RE | Admit: 2017-02-22 | Discharge: 2017-02-22 | Disposition: A | Discharge status: Home / Self Care | Payer: Self-pay | Source: Ambulatory Visit | Attending: Family Medicine | Admitting: Family Medicine

## 2017-02-22 ENCOUNTER — Other Ambulatory Visit: Payer: Self-pay | Admitting: Family Medicine

## 2017-02-22 ENCOUNTER — Ambulatory Visit
Admission: RE | Admit: 2017-02-22 | Discharge: 2017-02-22 | Disposition: A | Discharge status: Home / Self Care | Payer: No Typology Code available for payment source | Source: Ambulatory Visit | Attending: Family Medicine | Admitting: Family Medicine

## 2017-02-22 ENCOUNTER — Ambulatory Visit: Payer: Self-pay

## 2017-02-22 DIAGNOSIS — N6489 Other specified disorders of breast: Secondary | ICD-10-CM

## 2017-02-22 DIAGNOSIS — N644 Mastodynia: Secondary | ICD-10-CM

## 2017-02-28 ENCOUNTER — Encounter: Payer: Self-pay | Admitting: Family Medicine

## 2017-02-28 DIAGNOSIS — R928 Other abnormal and inconclusive findings on diagnostic imaging of breast: Secondary | ICD-10-CM | POA: Insufficient documentation

## 2017-04-22 ENCOUNTER — Other Ambulatory Visit: Payer: Self-pay

## 2017-04-22 ENCOUNTER — Encounter: Payer: Self-pay | Admitting: Urgent Care

## 2017-04-22 ENCOUNTER — Ambulatory Visit (INDEPENDENT_AMBULATORY_CARE_PROVIDER_SITE_OTHER): Payer: Self-pay | Admitting: Urgent Care

## 2017-04-22 VITALS — BP 114/70 | HR 62 | Temp 98.2°F | Resp 16 | Ht <= 58 in | Wt 150.0 lb

## 2017-04-22 DIAGNOSIS — R945 Abnormal results of liver function studies: Secondary | ICD-10-CM

## 2017-04-22 DIAGNOSIS — K760 Fatty (change of) liver, not elsewhere classified: Secondary | ICD-10-CM

## 2017-04-22 DIAGNOSIS — R7989 Other specified abnormal findings of blood chemistry: Secondary | ICD-10-CM

## 2017-04-22 DIAGNOSIS — E1165 Type 2 diabetes mellitus with hyperglycemia: Secondary | ICD-10-CM

## 2017-04-22 LAB — COMPREHENSIVE METABOLIC PANEL
ALBUMIN: 4.3 g/dL (ref 3.5–5.5)
ALK PHOS: 105 IU/L (ref 39–117)
ALT: 26 IU/L (ref 0–32)
AST: 25 IU/L (ref 0–40)
Albumin/Globulin Ratio: 1.2 (ref 1.2–2.2)
BUN/Creatinine Ratio: 23 (ref 9–23)
BUN: 13 mg/dL (ref 6–24)
Bilirubin Total: 0.2 mg/dL (ref 0.0–1.2)
CO2: 25 mmol/L (ref 20–29)
CREATININE: 0.56 mg/dL — AB (ref 0.57–1.00)
Calcium: 9.6 mg/dL (ref 8.7–10.2)
Chloride: 102 mmol/L (ref 96–106)
GFR calc Af Amer: 130 mL/min/{1.73_m2} (ref >59)
GFR, EST NON AFRICAN AMERICAN: 113 mL/min/{1.73_m2} (ref >59)
Globulin, Total: 3.6 g/dL (ref 1.5–4.5)
Glucose: 88 mg/dL (ref 65–99)
Potassium: 4.4 mmol/L (ref 3.5–5.2)
SODIUM: 139 mmol/L (ref 134–144)
TOTAL PROTEIN: 7.9 g/dL (ref 6.0–8.5)

## 2017-04-22 LAB — POCT GLYCOSYLATED HEMOGLOBIN (HGB A1C): HEMOGLOBIN A1C: 5.8

## 2017-04-22 MED ORDER — METFORMIN HCL 500 MG PO TABS: 500.0000 mg | ORAL_TABLET | Freq: Every day | ORAL | 3 refills | Status: DC

## 2017-04-22 NOTE — Patient Instructions (Addendum)
Dr. Marin Comment, [facility] at [place], [place], [place].     La diabetes mellitus y las normas bsicas de atencin mdica Diabetes Mellitus and Standards of Medical Care Tratar la diabetes (diabetes mellitus) puede ser complicado. Su tratamiento de la diabetes puede ser administrado por un equipo de profesionales de la salud, que incluye:  Un especialista en dietas y nutricin (nutricionista registrado).  Un enfermero.  Un educador certificado para el cuidado de la diabetes.  Un especialista en diabetes (endocrinlogo).  Un oculista.  Un mdico de cabecera.  Un dentista.  Sus mdicos siguen un programa para ayudarle a Engineer, production calidad en atencin. El programa siguiente es una gua general para su plan de control de la diabetes. Sus mdicos tambin podrn darle instrucciones ms especficas. Anlisis de HbA1c ( hemoglobina A1c) Este anlisis proporciona informacin sobre el control de la glucemia (glucosa en la sangre) durante los ltimos 2 o 52mses. Se utiliza para verificar si el plan de control de la diabetes debe ser modificado.  Si cumple los objetivos del tDel[place] este anlisis se realiza al mHalliburton Companyal ao.  Si no cumple los objetivos del tratamiento o si sus objetivos han cNepal este anlisis se realiza cuatro veces al ao.  Control de la presin arterial  Este control se realiza en cada visita mdica de rutina. Para la mComcast la meta es menos que 130/80. Consltele a su mdico cul es su meta para la presin arterial. Exmenes dentales y oculares  Visite al dAvayapor ao.  Si tiene diabetes tipo1, hgase un examen ocular en el trmino de 3 a 5aos despus del diagnstico y, luego, uArdelia Memsvez al ao despus del pTree surgeon ? Si le diagnosticaron diabetes tipo 1 siendo un nio, debe hacerse estudios al llegar a los 10 aos o ms y si ha tenido diabetes durante un perodo de 3 a 5 aos. Despus del primer examen, debe hacerse  un examen ocular todos los aos.  Si tiene diabetes tipo2, hgase un examen ocular tan pronto como le diagnostiquen la enfermedad y, lStart una vez por ao despus del pTree surgeon Examen de los pies  Una inspeccin visual de pies se hace en cada visita mdica de rutina. En este examen se buscan cortes, moretones, enrojecimiento, ampollas, llagas u otros problemas en los pies.  Su mdico le rChartered certified accountantexamen de pies completo una vez por ao. Este examen incluye revisar la estructura y la piel de los pies, y examinar los pulsos y la sensacin de los pies. ? Diabetes tipo1: Hgase su primer examen en el trmino de 3 a 5 aos despus del diagnstico. ? Diabetes tipo2: Hgase su primer examen tan pronto como le diagnostiquen la enfermedad.  Examnese a diario los pies en busca de cortes, moretones, enrojecimiento, ampollas o llagas. Si tiene alguno de eMoskowite Corneru otros problemas y no se curan, pngase en contacto con su mdico. Estudio de la funcin renal ( microalbmina en orina)  Este estudio se realiza una vez por ao. ? Diabetes tipo1: Hgase su primer estudio cinco aos despus del diagnstico. ? Diabetes tipo2: Hgase su primer estudio tan pronto como le diagnostiquen la enfermedad.  Si tiene enfermedad renal crnica, hgase un examen de creatinina srica y de ndice de filtracin glomerular estimada (eGFR, por sus siglas en ingls) una vez por ao. Perfil lipdico (colesterol, HDL, LDL, triglicridos)  Este examen se debe realizar cuando le diagnostiquen diabetes y cada cinco aos luego del pTree surgeon Si  est tomando medicamentos para bajar su colesterol, es posible que se deba realizar este examen cada ao. ? En relacin al LDL, el objetivo es tener menos de 14m/dl (5,5 mmol/l). Si tiene aUnited Parcel el objetivo es tener menos de 70 mg/dl (3,9 mmol/l). ? En relacin al HDL, el objetivo es tener 40 mg/dl (2,2 mmol/l) para los hombres y 50 mg/dl (2,8 mmol/l) para las mujeres. Un  nivel de colesterol HDL de 60 mg/dl (3,3 mmol/l) o superior da una cierta proteccin contra la enfermedad cardaca. ? En relacin a los triglicridos, el objetivo es tener menos de 150 mg/dl (8,3 mmol/l). Vacunas  Se recomienda aplicar de forma anual la vacuna contra la gripe (influenza) a todas las personas de 625mes en adelante que tengan diabetes.  La vacuna contra la neumona (antineumoccica) est recomendada para todas las personas de 2aos en adelante que tengan diabetes. Si tiene 65aos o ms, puede recibir laEngineer, manufacturingomo una serie de dos inyecciones diMapleview Se recomienda administrar la vacuna contra la hepatitisB en adultos poco despus de que hayan recibido el diagnstico de diabetes.  La vacuna Tdap (contra el ttanos, la difteria y la tosferina) debe aplicarse de la siguiente manera: ? Segn las pautas normales de vacunacin infantil en el caso de los nios. ? Cada 10aos en el caso de los adultos con diabetes.  La vacuna contra la culebrilla (herpes zster) se recomienda en personas que han tenido varicela y que tiene 5064os de edad o ms. Salud mental y emocional  Se recomienda reOptometristontroles para deHydrographic surveyorntomas de trastornos de laYouth workeransiedad y depresin en el momento del diagnstico, y en una etapa posterior segn sea necesario. Si los controles revelan la presencia de sntomas (el resultado de los controles es positivo), es posible que deba someterse a evaluaciones posteriores y lo deriven a un mdico esRegulatory affairs officerEducacin para el autocontrol de la diabetes  Es recomendable que se informe sobre cmo controlar su diabetes en el momento de recibir el diagnstico y luFairfieldsegn sea necesario. Plan de tratamiento  Su plan de tratamiento ser revisado en cada visita mdica. Resumen  Tratar la diabetes (diabetes mellitus) puede ser complicado. Su tratamiento de la diabetes puede ser administrado por un equipo  de profesionales de laTechnical sales engineer Sus mdicos siguen un programa para ayudarle a obEngineer, productionalidad en atencin.  Las normas asistenciales incluyen realizarse, regularmente, exmenes fsicos, anlisis de sangre y controles de la presin arterial, aplicarse vacunas, someterse a estudios de control e informarse sobre cmo controlar su diabetes.  Sus mdicos tambin podrn darle instrucciones ms especficas basndose en su salud. Esta informacin no tiene coMarine scientistl consejo del mdico. Asegrese de hacerle al mdico cualquier pregunta que tenga. Document Released: 08/26/2009 Document Revised: 05/13/2016 Document Reviewed: 11/01/2012 Elsevier Interactive Patient Education  2018 [employer]    IF you received an x-ray today, you will receive an invoice from [facility]. Please contact [facility] at [phone] questions or concerns regarding your invoice.   IF you received labwork today, you will receive an invoice from LaGardnersPlease contact [facility] at [phone] questions or concerns regarding your invoice.   Our billing staff will not be able to assist you with questions regarding bills from these companies.  You will be contacted with the lab results as soon as they are available. The fastest way to get your results is to activate your My Chart account. Instructions are located on  the last page of this paperwork. If you have not heard from [place] regarding the results in 2 weeks, please contact this office.

## 2017-04-22 NOTE — Progress Notes (Signed)
   MRN: [id number]  Subjective:   Bonnie Palmer is a 46 y.o. female who presents for follow up of Type 2 Diabetes Mellitus.   Patient is currently managed with metformin '500mg'$  BID and glipizide QD. Patient is checking home blood sugars. Home blood sugar is generally 80's-115.  Patient denies blurred vision, polydipsia, chest pain, nausea, vomiting, abdominal pain, hematuria, polyuria, skin infections, numbness or tingling. Patient is checking their feet daily. Denies foot concerns. Has not had a diabetic eye exam eye exam. Diet is very diabetic compliant. Patient is exercising. Denies smoking cigarettes or drinking alcohol.   Known diabetic complications: none  Bonnie Palmer has a current medication list which includes the following prescription(s): blood glucose meter kit and supplies, metformin, and lisinopril. Also has No Known Allergies.  Bonnie Palmer  has a past medical history of Fatty liver, Hyperlipidemia, and Pre-diabetes. Also  has a past surgical history that includes LAPAROSCOPIC CHOLECYSTECTOMY (N/A, 05/18/2012).  Immunizations: Flu vaccine   Objective:   PHYSICAL EXAM BP 114/70   Pulse 62   Temp 98.2 F (36.8 C) (Oral)   Resp 16   Ht '4\' 8"'$  (1.422 m)   Wt 150 lb (68 kg)   LMP 04/19/2017   SpO2 98%   BMI 33.63 kg/m   Wt Readings from Last 3 Encounters:  04/22/17 150 lb (68 kg)  02/19/17 158 lb 6.4 oz (71.8 kg)  01/11/17 159 lb (72.1 kg)   Physical Exam  Constitutional: She is oriented to person, place, and time. She appears well-developed and well-nourished.  HENT:  Mouth/Throat: Oropharynx is clear and moist.  Eyes: No scleral icterus.  Cardiovascular: Normal rate, regular rhythm and intact distal pulses. Exam reveals no gallop and no friction rub.  No murmur heard. Pulmonary/Chest: No respiratory distress. She has no wheezes. She has no rales.  Abdominal: Soft. Bowel sounds are normal. She exhibits no distension and no mass. There is no tenderness.  Musculoskeletal: She  exhibits no edema.  Neurological: She is alert and oriented to person, place, and time.  Skin: Skin is warm and dry. No rash noted. No erythema. No pallor.  Psychiatric: She has a normal mood and affect.   Results for orders placed or performed in visit on 04/22/17 (from the past 24 hour(s))  POCT glycosylated hemoglobin (Hb A1C)     Status: None   Collection Time: 04/22/17  9:01 AM  Result Value Ref Range   Hemoglobin A1C 5.8    Assessment and Plan :   1. Uncontrolled type 2 diabetes mellitus with hyperglycemia (HCC) - Stop glipizide, decrease to '500mg'$  metformin daily. Congratulated patient on her excellent diabetes management. Will follow up in 6 months. I recommended she obtain an eye exam with Dr. Marin [facility] at [facility]. - POCT glycosylated hemoglobin (Hb A1C) - Comprehensive metabolic panel  2. Elevated LFTs 3. Fatty liver - Repeat LFTs today, consider referral to GI if they remain elevated.  [name], PA-C [facility] [phone] 04/22/2017 8:28 AM

## 2017-05-13 ENCOUNTER — Ambulatory Visit: Payer: Self-pay | Admitting: Urgent Care

## 2017-10-21 ENCOUNTER — Encounter: Payer: Self-pay | Admitting: Urgent Care

## 2017-10-21 ENCOUNTER — Ambulatory Visit (INDEPENDENT_AMBULATORY_CARE_PROVIDER_SITE_OTHER): Payer: Self-pay | Admitting: Urgent Care

## 2017-10-21 VITALS — BP 120/78 | HR 70 | Temp 98.0°F | Resp 18 | Ht <= 58 in | Wt 156.5 lb

## 2017-10-21 DIAGNOSIS — E119 Type 2 diabetes mellitus without complications: Secondary | ICD-10-CM

## 2017-10-21 DIAGNOSIS — B351 Tinea unguium: Secondary | ICD-10-CM

## 2017-10-21 LAB — POCT GLYCOSYLATED HEMOGLOBIN (HGB A1C): Hemoglobin A1C: 6

## 2017-10-21 MED ORDER — FLUCONAZOLE 150 MG PO TABS: 150.0000 mg | ORAL_TABLET | ORAL | 0 refills | Status: DC

## 2017-10-21 MED ORDER — METFORMIN HCL 500 MG PO TABS: 500.0000 mg | ORAL_TABLET | Freq: Every day | ORAL | 3 refills | Status: DC

## 2017-10-21 NOTE — Progress Notes (Signed)
    MRN: [id number]  Subjective:   Bonnie Palmer is a 47 y.o. female who presents for follow up of Type 2 Diabetes Mellitus. Patient is currently managed with metformin. Patient is checking home blood sugars. Home blood sugar is generally 100-120's. Patient denies blurred vision, polydipsia, chest pain, nausea, vomiting, abdominal pain, hematuria, polyuria, skin infections, numbness or tingling. She does have left foot fungal infection. Patient is checking their feet daily. Diet is very compliant. Patient is exercising.  Known diabetic complications: none  Sarai has a current medication list which includes the following prescription(s): blood glucose meter kit and supplies and metformin. Also has No Known Allergies.  [name]  has a past medical history of Fatty liver, Hyperlipidemia, and Pre-diabetes. Also  has a past surgical history that includes Cholecystectomy (05/18/2012).  Immunizations: Patient is self-pay and prefers to avoid cost of immunizations.  Objective:   PHYSICAL EXAM BP 120/78   Pulse 70   Temp 98 F (36.7 C) (Oral)   Resp 18   Ht '4\' 8"'$  (1.422 m)   Wt 156 lb 8 oz (71 kg)   SpO2 97%   BMI 35.09 kg/m   Physical Exam  Constitutional: She is oriented to person, place, and time. She appears well-developed and well-nourished.  HENT:  Mouth/Throat: Oropharynx is clear and moist.  Eyes: Pupils are equal, round, and reactive to light. EOM are normal. No scleral icterus.  Cardiovascular: Normal rate, regular rhythm and intact distal pulses. Exam reveals no gallop and no friction rub.  No murmur heard. Pulmonary/Chest: No respiratory distress. She has no wheezes. She has no rales.  Abdominal: Bowel sounds are normal.  Neurological: She is alert and oriented to person, place, and time.  Skin: Skin is warm and dry.  Hyperkeratotic skin overlying left great toe and left pinky toe.  Psychiatric: She has a normal mood and affect.   Results for orders placed or performed in  visit on 10/21/17 (from the past 24 hour(s))  POCT glycosylated hemoglobin (Hb A1C)     Status: None   Collection Time: 10/21/17  8:56 AM  Result Value Ref Range   Hemoglobin A1C 6.0     Assessment and Plan :   Controlled type 2 diabetes mellitus without complication, without long-term current use of insulin ([place]) - Plan: POCT glycosylated hemoglobin (Hb A1C)  Onychomycosis  Will maintain current dose of metformin at 500 mg once daily.  Recommended continued healthy lifestyle.  Follow-up in 6 months.  Counseled patient that she needs an annual physical exam.  She plans on completing this at her next office visit.  [name], PA-C [facility] [phone] 10/21/2017 8:37 AM

## 2017-10-21 NOTE — Patient Instructions (Addendum)
Diabetes mellitus y nutricin Diabetes Mellitus and Nutrition Si sufre de diabetes (diabetes mellitus), es muy importante tener hbitos alimenticios saludables debido a que sus niveles de azcar en la sangre (glucosa) se ven afectados en gran medida por lo que come y bebe. [place] alimentos saludables en las cantidades adecuadas, aproximadamente a la misma hora todos los das, lo ayudar a:  Controlar la glucemia.  Disminuir el riesgo de sufrir una enfermedad cardaca.  Mejorar la presin arterial.  Alcanzar o mantener un peso saludable.  Todas las personas que sufren de diabetes son diferentes y cada una tiene necesidades diferentes en cuanto a un plan de alimentacin. El mdico puede recomendarle que trabaje con un especialista en dietas y nutricin (nutricionista) para elaborar el mejor plan para usted. Su plan de alimentacin puede variar segn factores como:  Las caloras que necesita.  Los medicamentos que toma.  Su peso.  Sus niveles de glucemia, presin arterial y colesterol.  Su nivel de actividad.  Otras afecciones que tenga, como enfermedades cardacas o renales.  Cmo me afectan los carbohidratos? Los carbohidratos afectan el nivel de glucemia ms que cualquier otro tipo de alimento. La ingesta de carbohidratos naturalmente aumenta la cantidad glucosa en la sangre. El recuento de carbohidratos es un mtodo destinado a llevar un registro de la cantidad de carbohidratos que se ingieren. El recuento de carbohidratos es importante para mantener la glucemia a un nivel saludable, en especial si utiliza insulina o toma determinados medicamentos por va oral para la diabetes. Es importante saber la cantidad de carbohidratos que se pueden ingerir en cada comida sin correr ningn riesgo. Esto es diferente en cada persona. El nutricionista puede ayudarlo a calcular la cantidad de carbohidratos que debe ingerir en cada comida y colacin. Los alimentos que contienen carbohidratos  incluyen:  Pan, cereal, arroz, pasta y galletas.  Papas y maz.  Guisantes, frijoles y lentejas.  Leche y yogur.  Frutas y jugo.  Postres, como pasteles, galletitas, helado y caramelos.  Cmo me afecta el alcohol? El alcohol puede provocar disminuciones sbitas de la glucemia (hipoglucemia), en especial si utiliza insulina o toma determinados medicamentos por va oral para la diabetes. La hipoglucemia es una afeccin potencialmente mortal. Los sntomas de la hipoglucemia (somnolencia, mareos y confusin) son similares a los sntomas de haber consumido demasiado alcohol. Si el mdico afirma que el alcohol es seguro para usted, siga estas pautas:  Limite el consumo de alcohol a no ms de 1 medida por da si es mujer y no est embarazada, y a 2 medidas si es hombre. Una medida equivale a 12oz (355ml) de cerveza, 5oz (148ml) de vino o 1oz (44ml) de bebidas de alta graduacin alcohlica.  No beba con el estmago vaco.  Mantngase hidratado con agua, gaseosas dietticas o t helado sin azcar.  Tenga en cuenta que las gaseosas comunes, los jugos y otros refrescos pueden contener mucha azcar y se deben contar como carbohidratos.  Consejos para seguir este plan Leer las etiquetas de los alimentos  Comience por controlar el tamao de la porcin en la etiqueta. La cantidad de caloras, carbohidratos, grasas y otros nutrientes mencionados en la etiqueta se basan en una porcin del alimento. Muchos alimentos contienen ms de una porcin por envase.  Verifique la cantidad total de gramos (g) de carbohidratos totales en una porcin. Puede calcular la cantidad de porciones de carbohidratos al dividir el total de carbohidratos por 15. Por ejemplo, si un alimento posee un total de 30g de carbohidratos, equivale a 2 porciones   de carbohidratos.  Verifique la cantidad de gramos (g) de grasas saturadas y grasas trans en una porcin. Escoja alimentos que no contengan grasa o que tengan un bajo  contenido.  Controle la cantidad de miligramos (mg) de sodio en una porcin. La State Farm de las personas deben limitar la ingesta de sodio total a menos de 2321m por dTraining and development officer  Siempre consulte la informacin nutricional de los alimentos etiquetados como "con bajo contenido de grasa" o "sin grasa". Estos alimentos pueden ser ms altos en azcar agregada o en carbohidratos refinados y deben evitarse.  Hable con el nutricionista para identificar sus objetivos diarios en cuanto a los nutrientes mencionados en la etiqueta. De compras  Evite comprar alimentos procesados, enlatados o prehechos. Estos alimentos tienden a tTEFL teachercantidad de gBethel sodio y azcar agregada.  Compre en la zona exterior de la tienda de comestibles. Esta incluye frutas y vNorthrop Grumman granos a granel, carnes frescas y productos lcteos frescos. Coccin  Utilice mtodos de coccin a baja temperatura, como hornear, en lugar de mtodos de coccin a alta temperatura, como frer en abundante aceite.  Cocine con aceites saludables, como el aceite de oAlva canola o gD[place]  Evite cocinar con manteca, crema o carnes con alto contenido de grasa. Planificacin de las comidas  CInternational Papercomidas y las colaciones de forma regular, preferentemente a la misma hora todos lHaines [place] Evite pasar largos perodos de tiempo sin comer.  Consuma alimentos ricos en fibra, como frutas frescas, verduras, frijoles y cereales integrales. Consulte al nutricionista sobre cuntas porciones de carbohidratos puede consumir en cada comida.  Consuma entre 4 y 6 onzas de protenas magras por da, como carnes mArgyle pollo, pescado, hFirst Data Corporationo tofu. 1 onza equivale a 1 onza de carne, pollo o pescado, 1 huevo, o 1/4 taza de tofu.  Coma algunos alimentos por da que contengan grasas saludables, como aguacates, frutos secos, semillas y pescado. Estilo de vida   Controle su nivel de glucemia con regularidad.  Haga ejercicio al menos 366mutos,  5das o ms por semana, o como se lo haya indicado el mdico.  Tome los meTenneco Ince lo haya indicado el mdico.  No consuma ningn producto que contenga nicotina o tabaco, como cigarrillos y ciPsychologist, sport and exerciseSi necesita ayuda para dejar de fumar, consulte al mdHess Corporationon un asesor o instructor en diabetes para identificar estrategias para controlar el estrs y cualquier desafo emocional y social. Cules son algunas de las preguntas que puedo hacerle a mi mdico?  Es necesario que me rena con unRadio broadcast assistantn diabetes?  Es necesario que me rena con un nutricionista?  A qu nmero puedo llamar si tengo preguntas?  Cules son los mejores momentos para controlar la glucemia? Dnde encontrar ms informacin:  Asociacin Americana de la Diabetes (American Diabetes Association): diabetes.org/food-and-fitness/food  Academia de Nutricin y DiInformation systems managerAcademy of Nutrition and Dietetics): wwPokerClues.dkInNew Riegeliabetes y laWatertown Town ReWater quality scientistNMedstar Saint Mary'S Hospitalf Diabetes and Digestive and Kidney Diseases) (InGulkanaNIH): wwContactWire.beesumen  Un plan de alimentacin saludable lo ayudar a coAeronautical engineerlucemia y maTheatre managern estilo de vida saludable.  Trabajar con un especialista en dietas y nutricin (nutricionista) puede ayudarlo a elInsurance claims handlere alimentacin para usted.  Tenga en cuenta que los carbohidratos y el alcohol tienen efectos inmediatos en sus niveles de glucemia. Es importante contar los carbohidratos y consumir alcohol con prudencia. Esta informacin no tiene coMarine scientistl consejo del  mdico. Asegrese de hacerle al mdico cualquier pregunta que tenga. Document Released: 09/08/2007 Document Revised: 09/21/2016 Document Reviewed:  09/21/2016 Elsevier Interactive Patient Education  2018 Elsevier Inc.      Infeccin por hongos en las uas (Fungal Nail Infection) La infeccin por hongos en las uas es una infeccin por hongos frecuente en las uas de los pies o de las manos. Este trastorno [employer] uas de los pies con ms frecuencia que las uas de las manos. Ms de Neomia Dear ua puede infectarse. Esta afeccin puede transmitirse de [place] persona a otra (es  contagiosa). CAUSAS La causa de esta afeccin es un hongo. Existen distintos tipos de hongos que pueden causar la infeccin. Estos hongos son ms frecuentes en las zonas hmedas y clidas. Si las manos o los pies entran en contacto con los hongos, se pueden introducir en una ruptura de las uas de las manos o de los pies y Games developer infeccin. FACTORES DE RIESGO Los siguientes factores pueden hacer que usted sea propenso a sufrir esta afeccin:  Ser varn.  Tener diabetes.  Ser Neomia Dear persona de edad avanzada.  Convivir con alguien que tiene hongos.  Caminar descalzo en zonas donde proliferan hongos, como duchas o vestuarios.  Tener mala circulacin.  Usar zapatos y calcetines que V[employer]on Corporation.  Tener pie de atleta.  Tener una ua lastimada o antecedentes recientes de una ciruga de uas.  Tener psoriasis.  Debilitamiento del sistema de defensa del cuerpo (sistema inmunitario). SNTOMAS Los sntomas de esta afeccin incluyen lo siguiente:  Cyndia Diver plida sobre la ua.  Engrosamiento de la ua.  Una ua que se torna amarilla o [place].  Bordes de las uas rugosos o quebradizos.  Una ua que se cae.  Una ua que se ha desprendido del lecho ungueal. DIAGNSTICO Esta afeccin se diagnostica mediante un examen fsico. El mdico podr tomar una muestra de la ua para examinarla y Engineer, manufacturing si tiene hongos. TRATAMIENTO Las infecciones leves no necesitan tratamiento. Si tiene Charter Communications uas, el tratamiento puede incluir  lo siguiente:  Medicamentos antimicticos por va oral. Deber tomar los medicamentos durante algunas semanas o meses y no ver los resultados hasta despus de un largo [place]. Estos medicamentos pueden tener efectos secundarios. Consulte al Dow Chemical a los que debe estar atento.  Cremas y esmaltes para uas antimicticos. Se pueden usar junto con los medicamentos antimicticos que se administran por va oral.  Tratamiento lser de las uas.  Ciruga para extirpar la ua. Esto puede ser [employer] casos ms graves de infecciones. El tratamiento es muy largo y la infeccin Metallurgist. INSTRUCCIONES PARA EL CUIDADO EN EL HOGAR Medicamentos  Tome o aplquese los medicamentos de venta libre y Science writer se lo haya indicado el mdico.  Consulte al mdico sobre el uso de pomadas mentoladas para las uas de [place]. Estilo de vida  No comparta elementos personales como toallas o cortauas.  Crtese las uas con frecuencia.  Lvese y squese las manos y los pies todos [place].  Use calcetines absorbentes y cmbiese los calcetines con frecuencia.  Use un tipo de calzado que permita que el aire [place], como sandalias o zapatillas de lona. Deseche los zapatos viejos.  Use guantes de goma si est trabajando con sus manos en lugares mojados.  No camine descalzo en duchas o vestuarios.  No concurra a un saln de cosmtica de uas si no usan instrumentos limpios.  No use uas artificiales.  Instrucciones generales  Concurra a todas las visitas de control como se lo haya indicado el mdico. Esto es importante.  Aplquese polvo antimictico en los pies y en los zapatos. SOLICITE ATENCIN MDICA SI: La infeccin no mejora o si empeora despus de varios meses. Esta informacin no tiene Theme park manager el consejo del mdico. Asegrese de hacerle al mdico cualquier pregunta que tenga. Document Released: 03/11/2005 Document Revised: 09/23/2015  Document Reviewed: 12/03/2014 Elsevier Interactive Patient Education  2018 ArvinMeritor.      IF you received an x-ray today, you will receive an invoice from [facility]. Please contact [facility] at [phone] with questions or concerns regarding your invoice.   IF you received labwork today, you will receive an invoice from [place]. Please contact [facility] at [phone] with questions or concerns regarding your invoice.   Our billing staff will not be able to assist you with questions regarding bills from these companies.  You will be contacted with the lab results as soon as they are available. The fastest way to get your results is to activate your My Chart account. Instructions are located on the last page of this paperwork. If you have not heard from [place] regarding the results in 2 weeks, please contact this office.

## 2017-12-27 ENCOUNTER — Other Ambulatory Visit: Payer: Self-pay | Admitting: Urgent Care

## 2017-12-27 NOTE — Telephone Encounter (Signed)
Diflucan refill Last Refill:10/21/17 # 6 Last OV: 10/21/17 PCP: Wallis BambergMario Mani, PA

## 2017-12-31 ENCOUNTER — Ambulatory Visit: Payer: Self-pay | Admitting: Urgent Care

## 2017-12-31 ENCOUNTER — Encounter: Payer: Self-pay | Admitting: Urgent Care

## 2017-12-31 ENCOUNTER — Other Ambulatory Visit: Payer: Self-pay

## 2017-12-31 VITALS — BP 113/74 | HR 71 | Temp 98.3°F | Ht <= 58 in | Wt 156.0 lb

## 2017-12-31 DIAGNOSIS — R200 Anesthesia of skin: Secondary | ICD-10-CM | POA: Insufficient documentation

## 2017-12-31 DIAGNOSIS — E119 Type 2 diabetes mellitus without complications: Secondary | ICD-10-CM

## 2017-12-31 DIAGNOSIS — B351 Tinea unguium: Secondary | ICD-10-CM

## 2017-12-31 DIAGNOSIS — R202 Paresthesia of skin: Principal | ICD-10-CM

## 2017-12-31 DIAGNOSIS — E1140 Type 2 diabetes mellitus with diabetic neuropathy, unspecified: Secondary | ICD-10-CM

## 2017-12-31 LAB — POCT CBC
GRANULOCYTE PERCENT: 56.5 % (ref 37–80)
HEMATOCRIT: 38 % (ref 37.7–47.9)
HEMOGLOBIN: 13 g/dL (ref 12.2–16.2)
Lymph, poc: 3.3 (ref 0.6–3.4)
MCH, POC: 27.9 pg (ref 27–31.2)
MCHC: 34.2 g/dL (ref 31.8–35.4)
MCV: 81.7 fL (ref 80–97)
MID (cbc): 0.8 (ref 0–0.9)
MPV: 7.8 fL (ref 0–99.8)
POC GRANULOCYTE: 5.3 (ref 2–6.9)
POC LYMPH %: 34.8 % (ref 10–50)
POC MID %: 8.7 % (ref 0–12)
Platelet Count, POC: 312 10*3/uL (ref 142–424)
RBC: 4.65 M/uL (ref 4.04–5.48)
RDW, POC: 13.4 %
WBC: 9.4 10*3/uL (ref 4.6–10.2)

## 2017-12-31 LAB — POCT GLYCOSYLATED HEMOGLOBIN (HGB A1C): HEMOGLOBIN A1C: 5.7 % — AB (ref 4.0–5.6)

## 2017-12-31 MED ORDER — GABAPENTIN 300 MG PO CAPS: 300.0000 mg | ORAL_CAPSULE | Freq: Two times a day (BID) | ORAL | 3 refills | Status: DC

## 2017-12-31 MED ORDER — FLUCONAZOLE 150 MG PO TABS: 150.0000 mg | ORAL_TABLET | ORAL | 0 refills | Status: DC

## 2017-12-31 NOTE — Patient Instructions (Addendum)
Infeccin por hongos en las uas (Fungal Nail Infection) La infeccin por hongos en las uas es una infeccin por hongos frecuente en las uas de los pies o de las manos. Este trastorno Weyerhaeuser Company uas de los pies con ms frecuencia que las uas de las manos. Ms de Ardelia Mems ua puede infectarse. Esta afeccin puede transmitirse de [place] persona a otra (es  contagiosa). CAUSAS La causa de esta afeccin es un hongo. Existen distintos tipos de hongos que pueden causar la infeccin. Estos hongos son ms frecuentes en las zonas hmedas y clidas. Si las manos o los pies entran en contacto con los hongos, se pueden introducir en una ruptura de las uas de las manos o de los pies y Immunologist infeccin. [place] hacer que usted sea propenso a sufrir esta afeccin:  Ser varn.  Tener diabetes.  Ser Ardelia Mems persona de edad avanzada.  Convivir con alguien que tiene hongos.  Caminar descalzo en zonas donde proliferan hongos, como duchas o vestuarios.  Tener mala circulacin.  Usar zapatos y calcetines que Micron Technology.  Tener pie de atleta.  Tener una ua lastimada o antecedentes recientes de una ciruga de uas.  Tener psoriasis.  Debilitamiento del sistema de defensa del cuerpo (sistema inmunitario). SNTOMAS Los sntomas de esta afeccin incluyen lo siguiente:  Etta Grandchild plida sobre la ua.  Engrosamiento de la ua.  Una ua que se torna amarilla o [place].  Luis Llorens Torres uas rugosos o quebradizos.  Una ua que se cae.  Una ua que se ha desprendido del lecho ungueal. DIAGNSTICO Esta afeccin se diagnostica mediante un examen fsico. El mdico podr tomar una muestra de la ua para examinarla y Hydrographic surveyor si tiene hongos. TRATAMIENTO Las infecciones leves no necesitan tratamiento. Si tiene B[employer]on, Dickinson and Company uas, el tratamiento puede incluir lo siguiente:  Medicamentos antimicticos por va oral. Deber tomar los  medicamentos durante algunas semanas o meses y no ver los resultados hasta despus de un largo [place]. Estos medicamentos pueden tener efectos secundarios. Consulte al TXU Corp a los que debe estar atento.  Cremas y esmaltes para uas antimicticos. Se pueden usar junto con los medicamentos antimicticos que se administran por va oral.  Tratamiento lser de las uas.  Ciruga para extirpar la ua. Esto puede ser [employer]are casos ms graves de infecciones. El tratamiento es muy [place] y la infeccin Psychologist, sport and exercise. INSTRUCCIONES PARA EL CUIDADO EN EL HOGAR Medicamentos  Tome o aplquese los medicamentos de venta libre y TEFL teacher se lo haya indicado el mdico.  Consulte al mdico sobre el uso de pomadas mentoladas para las uas de [place]. Estilo de vida  No comparta elementos personales como toallas o cortauas.  Crtese las uas con frecuencia.  Lvese y [place] y los pies todos [place].  Use calcetines absorbentes y cmbiese los calcetines con frecuencia.  Use un tipo de calzado que permita que el aire [place], como sandalias o zapatillas de lona. Deseche los zapatos viejos.  Use guantes de goma si est trabajando con sus manos en lugares mojados.  No camine descalzo en duchas o vestuarios.  No concurra a un saln de cosmtica de uas si no usan instrumentos limpios.  No use uas artificiales. Instrucciones generales  Concurra a todas las visitas de control como se lo haya indicado el mdico. Esto es importante.  Aplquese polvo antimictico en los pies y en los zapatos. SOLICITE ATENCIN  MDICA SI: La infeccin no mejora o si empeora despus de varios meses. Esta informacin no tiene Theme park manager el consejo del mdico. Asegrese de hacerle al mdico cualquier pregunta que tenga. Document Released: 03/11/2005 Document Revised: 09/23/2015 Document Reviewed: 12/03/2014 Elsevier Interactive Patient Education   2018 ArvinMeritor.     Neuropata perifrica (Peripheral Neuropathy) La neuropata perifrica es un tipo de lesin nerviosa. Afecta a los nervios que transportan seales entre la mdula espinal y otras partes del cuerpo. Estos se denominan nervios perifricos. Con la neuropata perifrica, es posible que haya lesiones en un nervio o en un grupo de nervios. CAUSAS Las causas de las lesiones en los nervios perifricos pueden ser [place]. En algunas personas se desconoce la causa de la neuropata perifrica. Algunas causas son:  Diabetes. Esta es la causa ms comn de la neuropata perifrica.  Lesin en un nervio.  Presin o tensin duraderas en un nervio.  Haze Justin de vitamina B, cuya causa puede ser el alcoholismo.  Infecciones.  Enfermedades autoinmunitarias, como la esclerosis mltiple y el lupus eritematoso sistmico.  Enfermedades nerviosas hereditarias.  Algunos medicamentos, como los medicamentos para Management consultant.  Sustancias txicas, como el plomo y [place].  Flujo deficiente de [employer].  Enfermedades renales.  Enfermedad tiroidea. SIGNOS Y SNTOMAS Los sntomas varan segn las personas. Los sntomas que tenga dependern del tipo de nervio que se haya lesionado. Los sntomas ms comunes son:  Prdida de la sensibilidad (adormecimiento) en los pies y [place].  Hormigueo en los pies y las manos.  Dolor y ardor.  Piel muy sensible.  Debilidad.  Imposibilidad para mover partes del cuerpo (parlisis).  Fasciculaciones (temblores musculares).  Torpeza o coordinacin deficiente.  Prdida del equilibrio.  Dificultad para controlar la vejiga.  Sensacin de Limited Brands.  Problemas sexuales. DIAGNSTICO La neuropata perifrica es un sntoma, no una enfermedad. Puede ser difcil encontrar la causa de la neuropata perifrica. Para averiguarlo, el mdico le har una historia clnica y un examen fsico. Tambin se realizar un examen neurolgico.  Esto involucra controlar aspectos que puedan verse afectados por el cerebro, la mdula espinal y los nervios (sistema nervioso). Por ejemplo, el mdico controlar sus reflejos, cmo se mueve y su grado de sensibilidad. Posiblemente le realicen otros tipos de pruebas, como las siguientes:  Anlisis de [place].  Una prueba del lquido de la mdula espinal.  Pruebas de diagnstico por imagen, como tomografas computarizadas (TC) o resonancias magnticas (RM).  Electromiograma (EMG). Esta prueba evala los nervios que controlan los msculos.  Pruebas de velocidad de conduccin nerviosa. Estas pruebas evalan la velocidad con la que los mensajes pasan a travs de los nervios.  Biopsia del nervio. Se extirpa un pedazo pequeo del nervio, que luego se examina con el microscopio. TRATAMIENTO  Con frecuencia, la neuropata perifrica se trata con medicamentos, entre los que se Baxter International siguientes: ? Analgsicos. Se pueden sugerir medicamentos recetados o de [employer]. ? Medicamentos anticonvulsivos. Estos posiblemente se usen para Chief Technology Officer. ? Antidepresivos. Estos tambin pueden ayudar a Engineer, materials provocado por la neuropata. ? Lidocana. Este es un anestsico. Puede usar un parche o recibir una dosis. ? Mexiletina. Este medicamento se [place] normalmente para ayudar a Chief Operating Officer los ritmos cardacos irregulares.  Ciruga. Es posible que la ciruga sea necesaria para Paramedic la presin en un nervio o para destruir un nervio que est provocando dolor.  La fisioterapia ayuda al movimiento.  Dispositivos de asistencia que ayuden al [employer]W [employer].  INSTRUCCIONES PARA EL  CUIDADO EN EL Nucor CorporationHOGAR  Tome solo medicamentos de venta libre o recetados, segn las indicaciones del mdico. Siga cuidadosamente las instrucciones de Probation officercualquier medicamento. No tome ningn otro medicamento sin obtener primero la aprobacin del mdico.  Si tiene diabetes, trabaje estrechamente con el mdico para mantener bajo  control el nivel de Production assistant, radioazcar en sangre.  Si siente adormecimiento en los pies, haga lo siguiente: ? Realice controles todos los das para detectar signos de lesin o infeccin. Observe seales de enrojecimiento, calor e hinchazn. ? Use calcetines acolchados y zapatos cmodos. Estos ayudan a Anadarko Petroleum Corporationproteger los pies.  No realice actividades que ejerzan presin sobre el nervio lesionado.  No fume. Fumar evita que la sangre llegue a los nervios lesionados.  Evite o limite el consumo alcohol. Demasiado alcohol puede provocar una falta de vitaminas B. Estas vitaminas son necesarias para tener nervios saludables.  Desarrolle un buen sistema de apoyo. Hacerle frente a la neuropata perifrica puede ser estresante. Hable con un profesional de la salud mental o nase a un grupo de apoyo si le cuesta enfrentar la situacin.  Concurra a las consultas de control con su mdico segn las indicaciones.  SOLICITE ATENCIN MDICA SI:  Advierte signos o sntomas nuevos de la neuropata perifrica.  Le est costando lidiar con la neuropata perifrica desde el punto de vista emocional.  Tiene fiebre.  SOLICITE ATENCIN MDICA DE INMEDIATO SI:  Tiene una lesin o infeccin que no se cura.  Se siente muy mareado o comienza a vomitar.  Siente dolor en el pecho.  Tiene dificultad para respirar.  Esta informacin no tiene Theme park managercomo fin reemplazar el consejo del mdico. Asegrese de hacerle al mdico cualquier pregunta que tenga. Document Released: 09/17/2008 Document Revised: 09/23/2015 Document Reviewed: 02/06/2013 Elsevier Interactive Patient Education  2017 ArvinMeritorElsevier Inc.    IF you received an x-ray today, you will receive an invoice [facility]. Please contact [facility] at [phone] with questions or concerns regarding your invoice.   IF you received labwork today, you will receive an invoice from [facility]. Please contact [facility] at [phone] with questions or concerns  regarding your invoice.   Our billing staff will not be able to assist you with questions regarding bills from these companies.  You will be contacted with the lab results as soon as they are available. The fastest way to get your results is to activate your My Chart account. Instructions are located on the last page of this paperwork. If you have not heard from us regarding the results in 2 weeks, please contact this office.

## 2017-12-31 NOTE — Progress Notes (Signed)
MRN: [id number] DOB: October 02, 1970  Subjective:   Bonnie Palmer is a 47 y.o. female presenting for recurrence of numbness and tingling of her hands and feet.  Patient has had peripheral neuropathy before, worst when she was uncontrolled with her diabetes.  She denies falls, trauma, back pain, neck pain.  She has done very well with her diabetes and is confused why she continues to have neuropathy.  She also reports that she would like additional Diflucan to help with her onychomycosis, reports that she did very well in the past with this.  However, she did not achieve full resolution of her symptoms.  Nehemiah has a current medication list which includes the following prescription(s): blood glucose meter kit and supplies, metformin, and fluconazole. Also has No Known Allergies.  Bonnie Palmer  has a past medical history of Fatty liver, Hyperlipidemia, and Pre-diabetes. Also  has a past surgical history that includes Cholecystectomy (05/18/2012).  Objective:   Vitals: BP 113/74 (BP Location: Left Arm, Patient Position: Sitting, Cuff Size: Normal)   Pulse 71   Temp 98.3 F (36.8 C) (Oral)   Ht 4' 8"  (1.422 m)   Wt 156 lb (70.8 kg)   LMP 12/24/2017   SpO2 94%   BMI 34.97 kg/m   Physical Exam  Constitutional: She is oriented to person, place, and time. She appears well-developed and well-nourished.  Cardiovascular: Normal rate.  Pulmonary/Chest: Effort normal.  Neurological: She is alert and oriented to person, place, and time.  Skin:       Results for orders placed or performed in visit on 12/31/17 (from the past 24 hour(s))  POCT CBC     Status: None   Collection Time: 12/31/17  1:57 PM  Result Value Ref Range   WBC 9.4 4.6 - 10.2 K/uL   Lymph, poc 3.3 0.6 - 3.4   POC LYMPH PERCENT 34.8 10 - 50 %L   MID (cbc) 0.8 0 - 0.9   POC MID % 8.7 0 - 12 %M   POC Granulocyte 5.3 2 - 6.9   Granulocyte percent 56.5 37 - 80 %G   RBC 4.65 4.04 - 5.48 M/uL   Hemoglobin 13.0 12.2 - 16.2 g/dL   HCT, POC 38.0 37.7 - 47.9 %   MCV 81.7 80 - 97 fL   MCH, POC 27.9 27 - 31.2 pg   MCHC 34.2 31.8 - 35.4 g/dL   RDW, POC 13.4 %   Platelet Count, POC 312 142 - 424 K/uL   MPV 7.8 0 - 99.8 fL  POCT glycosylated hemoglobin (Hb A1C)     Status: Abnormal   Collection Time: 12/31/17  2:03 PM  Result Value Ref Range   Hemoglobin A1C 5.7 (A) 4.0 - 5.6 %   HbA1c POC (<> result, manual entry)  4.0 - 5.6 %   HbA1c, POC (prediabetic range)  5.7 - 6.4 %   HbA1c, POC (controlled diabetic range)  0.0 - 7.0 %   Assessment and Plan :   Numbness and tingling in both hands - Plan: POCT CBC, POCT glycosylated hemoglobin (Hb A1C)  Numbness and tingling of both feet  Controlled type 2 diabetes mellitus without complication, without long-term current use of insulin (HCC)  Onychomycosis  Will start gabapentin to address peripheral neuropathy associated with her diabetes.  For now patient would like to come off of metformin altogether given that her A1c is at 5.7%.  I refilled her Diflucan to address her onychomycosis.  Patient is to follow-up in 3 months  for recheck.  [name], PA-C [facility] [phone] 12/31/2017  1:47 PM

## 2018-02-17 ENCOUNTER — Telehealth: Payer: Self-pay | Admitting: Urgent Care

## 2018-02-17 NOTE — Telephone Encounter (Signed)
Called pt and left VM for her to call office and reschedule her appt with Marquita Palms that is scheduled 04/08/18. When pt calls back, please reschedule her for : 3 MONTH RECHECK ON HER PERIPHERAL NEUROPATHY AND DIABETES (NO INSURANCE) SELF PAY (STOP)  And the second appt is 11/8:COMPLETE PHYSICAL AND DIABETES RECHECK PER MARIO (NO INSURANCE) SELF PAY (STOP)  Thank you!

## 2018-04-08 ENCOUNTER — Ambulatory Visit: Payer: Self-pay | Admitting: Urgent Care

## 2018-04-22 ENCOUNTER — Encounter: Payer: Self-pay | Admitting: Urgent Care

## 2018-11-28 DIAGNOSIS — E119 Type 2 diabetes mellitus without complications: Secondary | ICD-10-CM | POA: Insufficient documentation

## 2018-11-28 DIAGNOSIS — E1169 Type 2 diabetes mellitus with other specified complication: Secondary | ICD-10-CM | POA: Insufficient documentation

## 2018-12-22 LAB — HM DIABETES EYE EXAM

## 2019-02-10 ENCOUNTER — Encounter: Payer: Self-pay | Admitting: Family Medicine

## 2019-02-10 ENCOUNTER — Other Ambulatory Visit: Payer: Self-pay

## 2019-02-10 ENCOUNTER — Ambulatory Visit (INDEPENDENT_AMBULATORY_CARE_PROVIDER_SITE_OTHER): Payer: Self-pay | Admitting: Family Medicine

## 2019-02-10 VITALS — BP 124/84 | HR 75 | Temp 98.6°F | Ht <= 58 in | Wt 165.0 lb

## 2019-02-10 DIAGNOSIS — E119 Type 2 diabetes mellitus without complications: Secondary | ICD-10-CM

## 2019-02-10 LAB — POCT GLYCOSYLATED HEMOGLOBIN (HGB A1C): Hemoglobin A1C: 6.4 % — AB (ref 4.0–5.6)

## 2019-02-10 MED ORDER — METFORMIN HCL 500 MG PO TABS: 500.0000 mg | ORAL_TABLET | Freq: Every day | ORAL | 1 refills | Status: DC

## 2019-02-10 NOTE — Progress Notes (Signed)
8/28/20201:35 PM  Bonnie Palmer [birth year], 48 y.o., female [id number]  Chief Complaint  Patient presents with  . Diabetes    HPI:   Patient is a 48 y.o. female with past medical history significant for DM2 who presents today to establish care  Previous PCP Bess Harvest, [place] Last OV July 2019  Checks fasting cbgs daily This morning 145, today higher than normal  Usually 110-120s Denies any low hypoglycemia Takes metformin 53m daily Tries to do aerobic exercise 3 x week Struggling with diet Denies any polyuria or polydipsia Denies any fever, chills, cough, chest pain, abd pain, nausea, vomiting, numbness or tingling Endorses SOB - intermittent, at night, related to drinking herbal life tea or eating large meals  [facility] Lab Results  Component Value Date   HGBA1C 5.7 (A) 12/31/2017   HGBA1C 6.0 10/21/2017   HGBA1C 5.8 04/22/2017   Lab Results  Component Value Date   CREATININE 0.56 (L) 04/22/2017    Depression screen PHQ 2/9 04/22/2017 02/19/2017 01/11/2017  Decreased Interest 0 0 0  Down, Depressed, Hopeless 0 0 0  PHQ - 2 Score 0 0 0    Fall Risk  04/22/2017 02/19/2017 01/11/2017 11/18/2016 05/11/2016  Falls in the past year? No No No No No     No Known Allergies  Prior to Admission medications   Medication Sig Start Date End Date Taking? Authorizing Provider  blood glucose meter kit and supplies KIT Check fasting blood sugar daily. 01/11/17  Yes MJaynee Eagles PA-C  gabapentin (NEURONTIN) 300 MG capsule Take 1 capsule (300 mg total) by mouth 2 (two) times daily. 12/31/17  Yes MJaynee Eagles PA-C  metFORMIN (GLUCOPHAGE) 500 MG tablet Take 1 tablet (500 mg total) by mouth daily with breakfast. 10/21/17  Yes MJaynee Eagles PA-C    Past Medical History:  Diagnosis Date  . Fatty liver   . Hyperlipidemia   . Pre-diabetes     Past Surgical History:  Procedure Laterality Date  . CHOLECYSTECTOMY  05/18/2012   Procedure: LAPAROSCOPIC  CHOLECYSTECTOMY;  Surgeon: FStark Klein MD;  [facility]  Service: General;  Laterality: N/A;    Social History   Tobacco Use  . Smoking status: Former Smoker    Types: Cigarettes  . Smokeless tobacco: Never Used  Substance Use Topics  . Alcohol use: Yes    Comment: 4-5 drinks a week - not in the same day    Family History  Problem Relation Age of Onset  . Diabetes Mother   . Diabetes Brother     ROS Per hpi  OBJECTIVE:  Today's Vitals   02/10/19 1332  BP: 124/84  Pulse: 75  Temp: 98.6 F (37 C)  TempSrc: Oral  SpO2: 95%  Weight: 165 lb (74.8 kg)  Height: 4' 8"  (1.422 m)   Body mass index is 36.99 kg/m.  Wt Readings from Last 3 Encounters:  02/10/19 165 lb (74.8 kg)  12/31/17 156 lb (70.8 kg)  10/21/17 156 lb 8 oz (71 kg)    Physical Exam Vitals signs and nursing note reviewed.  Constitutional:      Appearance: She is well-developed.  HENT:     Head: Normocephalic and atraumatic.     Mouth/Throat:     Pharynx: No oropharyngeal exudate.  Eyes:     General: No scleral icterus.    Conjunctiva/sclera: Conjunctivae normal.     Pupils: Pupils are equal, round, and reactive to light.  Neck:  Musculoskeletal: Neck supple.  Cardiovascular:     Rate and Rhythm: Normal rate and regular rhythm.     Heart sounds: Normal heart sounds. No murmur. No friction rub. No gallop.   Pulmonary:     Effort: Pulmonary effort is normal.     Breath sounds: Normal breath sounds. No wheezing or rales.  Skin:    General: Skin is warm and dry.  Neurological:     Mental Status: She is alert and oriented to person, place, and time.      Diabetic Foot Form - Detailed   Diabetic Foot Exam - detailed Can the patient see the bottom of their feet?: No Are the shoes appropriate in style and fit?: Yes Is there swelling or and abnormal foot shape?: No Is there a claw toe deformity?: No Is there elevated skin temparature?: No Is there foot or ankle muscle weakness?: No  Normal Range of Motion: No Semmes-Weinstein Monofilament Test R Site 1-Great Toe: Neg L Site 1-Great Toe: Neg        Results for orders placed or performed in visit on 02/10/19 (from the past 24 hour(s))  POCT A1C     Status: Abnormal   Collection Time: 02/10/19  1:56 PM  Result Value Ref Range   Hemoglobin A1C 6.4 (A) 4.0 - 5.6 %   HbA1c POC (<> result, manual entry)     HbA1c, POC (prediabetic range)     HbA1c, POC (controlled diabetic range)      No results found.   ASSESSMENT and PLAN  1. Controlled type 2 diabetes mellitus without complication, without long-term current use of insulin (HCC) Controlled. Continue current regime.  - CMP14+EGFR - Lipid panel - Microalbumin / creatinine urine ratio - POCT A1C  Other orders - metFORMIN (GLUCOPHAGE) 500 MG tablet; Take 1 tablet (500 mg total) by mouth daily with breakfast.  Return in about 6 months (around 08/13/2019).

## 2019-02-10 NOTE — Patient Instructions (Addendum)
Breast and Cervical Cancer Program for [facility]: 1. [place], Fonnie Mu, Coordinator, [phone] 2. [place] regional, Orlie Pollen, Cordinator, [phone]    Diabetes mellitus y nutricin, en adultos Diabetes Mellitus and Nutrition, Adult Si sufre de diabetes (diabetes mellitus), es muy importante tener hbitos alimenticios saludables debido a que sus niveles de Psychologist, counselling sangre (glucosa) se ven afectados en gran medida por lo que come y bebe. [place] alimentos saludables en las cantidades [place], aproximadamente a la [employer], [place] ayudar a:  Scientist, physiological glucemia.  Disminuir el riesgo de sufrir una enfermedad cardaca.  Mejorar la presin arterial.  Barista o mantener un peso saludable. Todas las personas que sufren de diabetes son diferentes y cada una tiene necesidades diferentes en cuanto a un plan de alimentacin. El mdico puede recomendarle que trabaje con un especialista en dietas y nutricin (nutricionista) para Tax adviser plan para usted. Su plan de alimentacin puede variar segn factores como:  Las caloras que necesita.  Los medicamentos que toma.  Su peso.  Sus niveles de glucemia, presin arterial y colesterol.  Su nivel de [place].  Otras afecciones que tenga, como enfermedades cardacas o renales. Cmo me afectan los carbohidratos? Los carbohidratos, o hidratos de carbono, afectan su nivel de glucemia ms que cualquier otro tipo de alimento. La ingesta de carbohidratos naturalmente aumenta la cantidad de [employer]. El recuento de carbohidratos es un mtodo destinado a Midwife un registro de la cantidad de carbohidratos que se consumen. El recuento de carbohidratos es importante para Pharmacologist la glucemia a un nivel saludable, especialmente si utiliza insulina o toma determinados medicamentos por va oral para la diabetes. Es importante conocer la cantidad de carbohidratos que se pueden ingerir en cada  comida sin correr Surveyor, minerals. Esto es Government social research officer. Su nutricionista puede ayudarlo a calcular la cantidad de carbohidratos que debe ingerir en cada comida y en cada refrigerio. Entre los alimentos que contienen carbohidratos, se incluyen:  Pan, cereal, arroz, pastas y galletas.  Papas y maz.  Guisantes, frijoles y lentejas.  Leche y Dentist.  Nils Pyle y [place].  Postres, como pasteles, galletas, helado y caramelos. Cmo me afecta el alcohol? El alcohol puede provocar disminuciones sbitas de la glucemia (hipoglucemia), especialmente si utiliza insulina o toma determinados medicamentos por va oral para la diabetes. La hipoglucemia es una afeccin potencialmente mortal. Los sntomas de la hipoglucemia (somnolencia, mareos y confusin) son similares a los sntomas de haber consumido demasiado alcohol. Si el mdico afirma que el alcohol es seguro para usted, [place] estas pautas:  Limite el consumo de alcohol a no ms de por da si es mujer y no est [place], y a si es hombre. Una medida equivale a 12oz ( ) de cerveza, 5oz ( ) de vino o 1oz (70ml) de bebidas alcohlicas de alta graduacin.  No beba con el estmago vaco.  Mantngase hidratado bebiendo agua, refrescos dietticos o t helado sin azcar.  Tenga en cuenta que los refrescos comunes, los jugos y otras bebida para Engineer, manufacturing pueden contener mucha azcar y se deben contar como carbohidratos. Cules son algunos consejos para seguir este plan?  Leer las etiquetas de los alimentos  Comience por leer el tamao de la porcin en la "Informacin nutricional" en las etiquetas de los alimentos envasados y las bebidas. La cantidad de caloras, carbohidratos, grasas y otros nutrientes mencionados en la etiqueta se basan en una porcin del alimento. Muchos alimentos contienen ms de una porcin por  envase.  Verifique la cantidad total de gramos (g) de carbohidratos totales en una porcin. Puede  calcular la cantidad de porciones de carbohidratos al dividir el total de carbohidratos por 15. Por ejemplo, si un alimento tiene un total de 30g de carbohidratos, equivale a 2 porciones de carbohidratos.  Verifique la cantidad de gramos (g) de grasas saturadas y grasas trans en una porcin. Escoja alimentos que no contengan grasa o que tengan un bajo contenido.  Verifique la cantidad de miligramos (mg) de sal (sodio) en una porcin. La mayora de las personas deben limitar la ingesta de sodio total a menos de 2300mg  por Futures traderda.  Siempre consulte la informacin nutricional de los alimentos etiquetados como "con bajo contenido de grasa" o "sin grasa". Estos alimentos pueden tener un mayor contenido de International aid/development workerazcar agregada o carbohidratos refinados, y deben evitarse.  Hable con su nutricionista para identificar sus objetivos diarios en cuanto a los nutrientes mencionados en la etiqueta. Al ir de compras  Evite comprar alimentos procesados, enlatados o precocinados. Estos alimentos tienden a Counselling psychologisttener una mayor cantidad de [place]grasa, sodio y azcar agregada.  Compre en la zona exterior de la tienda de comestibles. Esta zona incluye frutas y verduras frescas, granos a granel, carnes frescas y productos lcteos frescos. Al cocinar  Utilice mtodos de coccin a baja temperatura, como hornear, en lugar de mtodos de coccin a alta temperatura, como frer en abundante aceite.  Cocine con aceites saludables, como el aceite de [place], canola o [place]girasol.  Evite cocinar con manteca, crema o carnes con alto contenido de grasa. Planificacin de las comidas  Coma las comidas y los refrigerios regularmente, preferentemente a la misma hora todos [place]ol[place] das. Evite pasar largos perodos de tiempo sin comer.  Consuma alimentos ricos en fibra, como frutas frescas, verduras, frijoles y cereales integrales. Consulte a su nutricionista sobre cuntas porciones de carbohidratos puede consumir en cada comida.  Consuma entre 4 y 6  onzas (oz) de protenas magras por da, como carnes [place]villemagras, pollo, pescado, huevos o tofu. Una onza de protena magra equivale a: ? 1 onza de carne, pollo o pescado. ? 1huevo. ?  taza de tofu.  Coma algunos alimentos por da que contengan grasas saludables, como aguacates, frutos secos, semillas y pescado. Estilo de vida  Controle su nivel de glucemia con regularidad.  Haga actividad fsica habitualmente como se lo haya indicado el mdico. Esto puede incluir lo siguiente: ? 150minutos semanales de ejercicio de intensidad moderada o alta. Esto podra incluir caminatas dinmicas, ciclismo o gimnasia acutica. ? Realizar ejercicios de elongacin y de fortalecimiento, como yoga o levantamiento de pesas, por lo menos 2veces por semana.  Tome los Monsanto Companymedicamentos como se lo haya indicado el mdico.  No consuma ningn producto que contenga nicotina o tabaco, como cigarrillos y Administrator, Civil Servicecigarrillos electrnicos. Si necesita ayuda para dejar de fumar, consulte al CIGNAmdico.  Trabaje con un asesor o instructor en diabetes para identificar estrategias para controlar el estrs y cualquier desafo emocional y social. Preguntas para hacerle al mdico  Es necesario que consulte a IT trainerun instructor en el cuidado de la diabetes?  Es necesario que me rena con un nutricionista?  A qu nmero puedo llamar si tengo preguntas?  Cules son los mejores momentos para controlar la glucemia? Dnde encontrar ms informacin:  Asociacin Estadounidense de la Diabetes (American Diabetes Association): diabetes.org  Academia de Nutricin y Pension scheme managerDiettica (Academy of Nutrition and Dietetics): www.eatright.org  The Krogernstituto Nacional de la Diabetes y las Enfermedades Digestivas y Renales [facility](National Institute of Diabetes and  Digestive and Kidney Diseases, NIH): DesMoinesFuneral.dk Resumen  Un plan de alimentacin saludable lo ayudar a controlar la glucemia y Theatre manager un estilo de vida saludable.  Trabajar con un especialista en  dietas y nutricin (nutricionista) puede ayudarlo a Insurance claims handler de alimentacin para usted.  Tenga en cuenta que los carbohidratos (hidratos de carbono) y el alcohol tienen efectos inmediatos en sus niveles de glucemia. Es importante contar los carbohidratos que ingiere y consumir alcohol con prudencia. Esta informacin no tiene Marine scientist el consejo del mdico. Asegrese de hacerle al mdico cualquier pregunta que tenga. Document Released: 09/08/2007 Document Revised: 02/09/2017 Document Reviewed: 09/21/2016 Elsevier Patient Education  2020 [employer].

## 2019-02-11 LAB — CMP14+EGFR
ALT: 47 IU/L — ABNORMAL HIGH (ref 0–32)
AST: 39 IU/L (ref 0–40)
Albumin/Globulin Ratio: 1.3 (ref 1.2–2.2)
Albumin: 4.6 g/dL (ref 3.8–4.8)
Alkaline Phosphatase: 120 IU/L — ABNORMAL HIGH (ref 39–117)
BUN/Creatinine Ratio: 18 (ref 9–23)
BUN: 10 mg/dL (ref 6–24)
Bilirubin Total: <0.2 mg/dL (ref 0.0–1.2)
CO2: 21 mmol/L (ref 20–29)
Calcium: 9.8 mg/dL (ref 8.7–10.2)
Chloride: 102 mmol/L (ref 96–106)
Creatinine, Ser: 0.56 mg/dL — ABNORMAL LOW (ref 0.57–1.00)
GFR calc Af Amer: 128 mL/min/{1.73_m2} (ref >59)
GFR calc non Af Amer: 111 mL/min/{1.73_m2} (ref >59)
Globulin, Total: 3.5 g/dL (ref 1.5–4.5)
Glucose: 88 mg/dL (ref 65–99)
Potassium: 4.2 mmol/L (ref 3.5–5.2)
Sodium: 138 mmol/L (ref 134–144)
Total Protein: 8.1 g/dL (ref 6.0–8.5)

## 2019-02-11 LAB — LIPID PANEL
Chol/HDL Ratio: 3.5 ratio (ref 0.0–4.4)
Cholesterol, Total: 208 mg/dL — ABNORMAL HIGH (ref 100–199)
HDL: 60 mg/dL (ref >39)
LDL Calculated: 114 mg/dL — ABNORMAL HIGH (ref 0–99)
Triglycerides: 170 mg/dL — ABNORMAL HIGH (ref 0–149)
VLDL Cholesterol Cal: 34 mg/dL (ref 5–40)

## 2019-04-23 IMAGING — US US ABDOMEN LIMITED
1 series · 14 of 25 positions shown · non-contrast
Comparison: Ultrasound 05/17/2012 .

CLINICAL DATA: Elevated LFTs. Right upper quadrant pain.
Cholecystectomy .

EXAM:
ULTRASOUND ABDOMEN LIMITED RIGHT UPPER QUADRANT

[Series 1: us abdomen limited · 0.25mm/px · 14 of 35 slices shown]
[im 1/35]
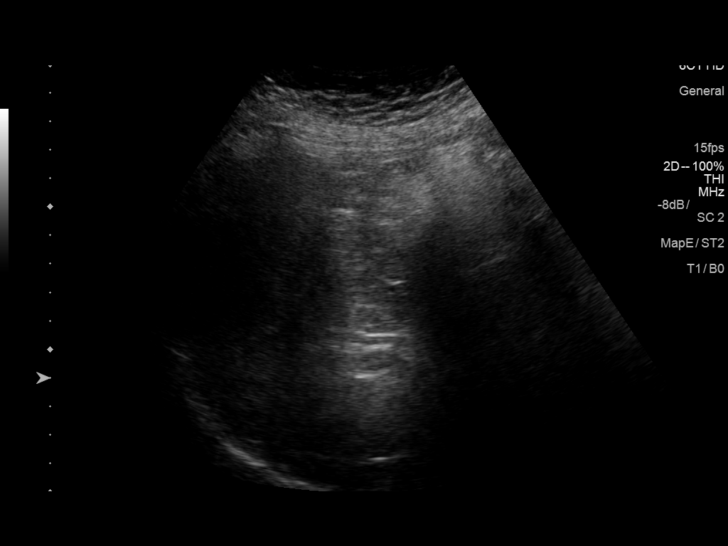
[im 3/35]
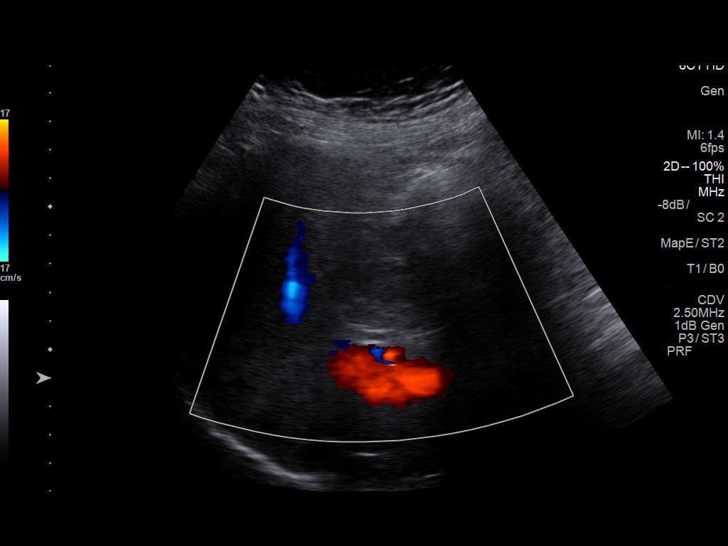
[im 6/35]
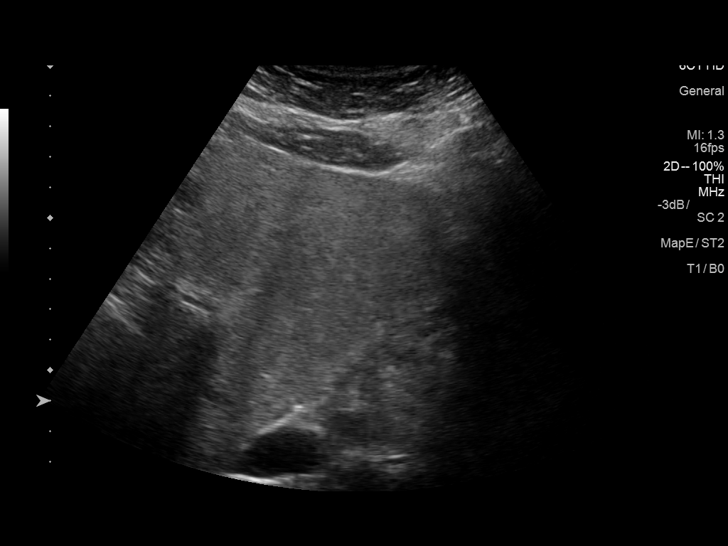
[im 9/35]
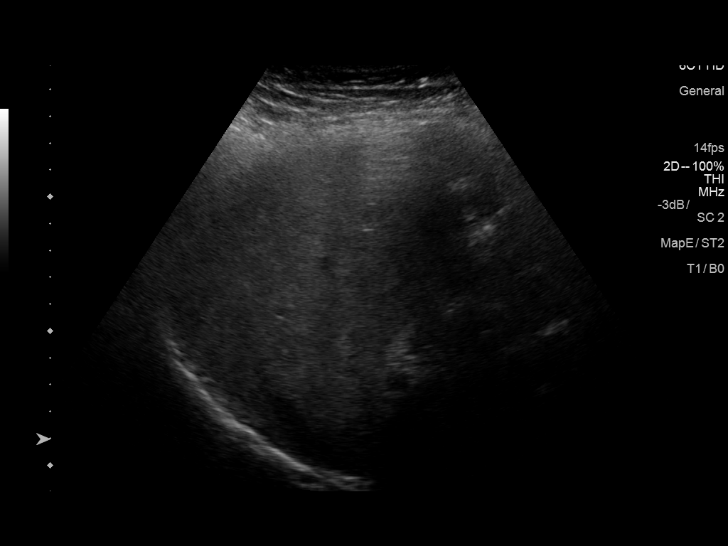
[im 12/35]
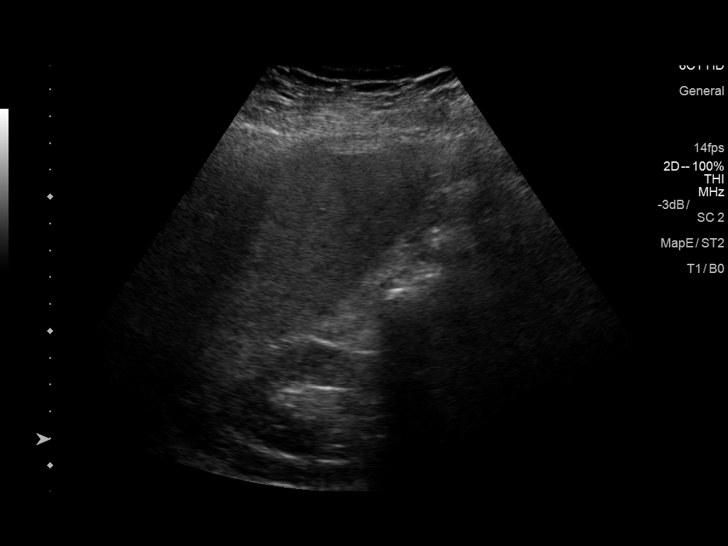
[im 13/35]
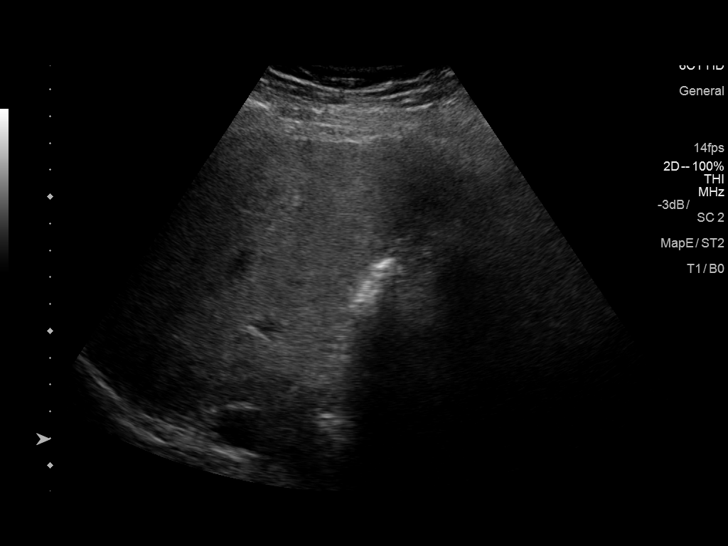
[im 16/35]
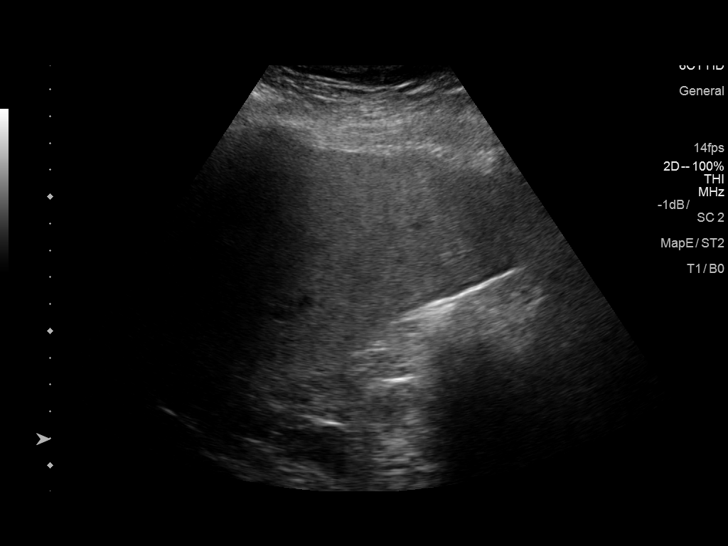
[im 19/35]
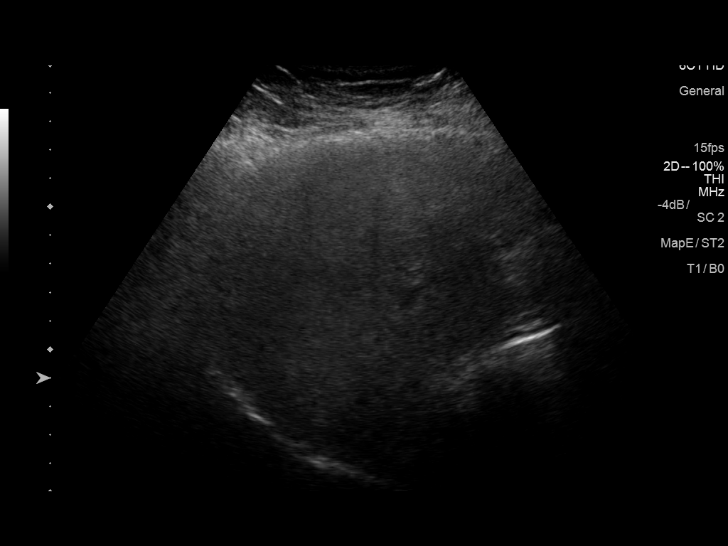
[im 22/35]
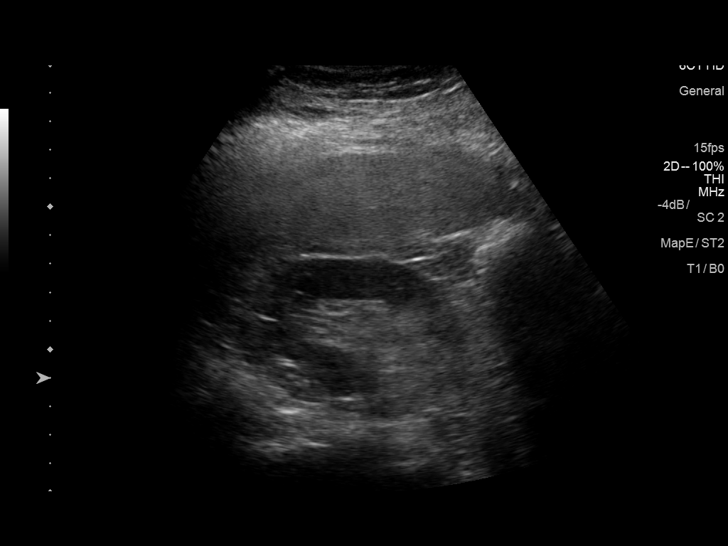
[im 23/35]
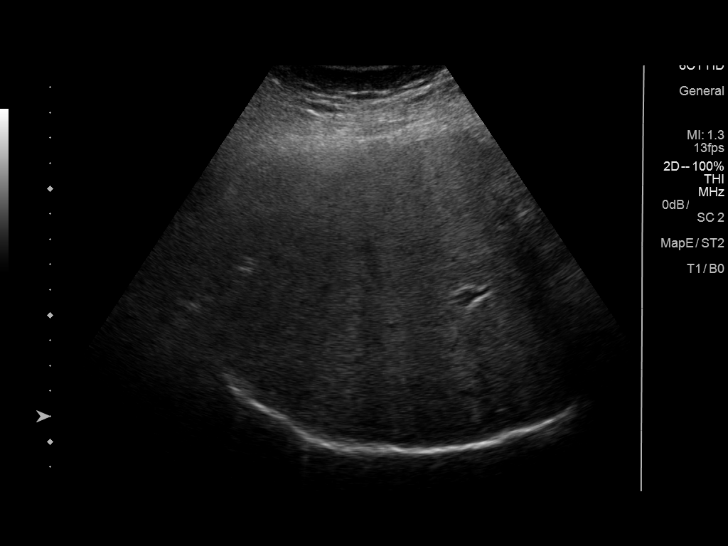
[im 26/35]
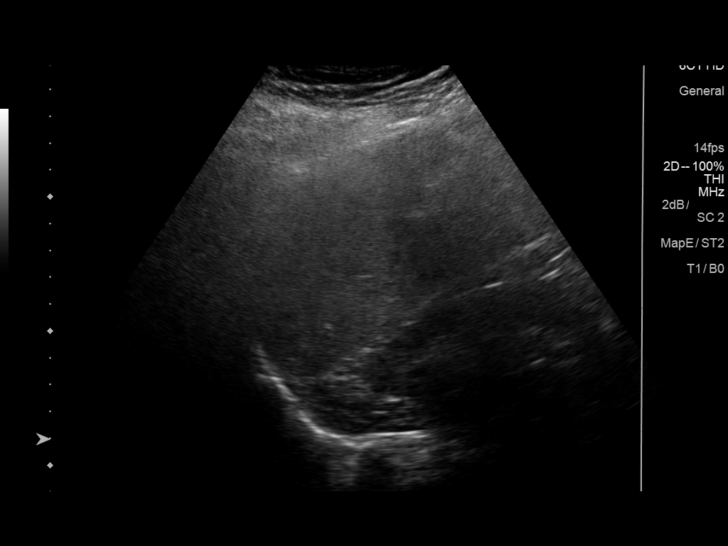
[im 29/35]
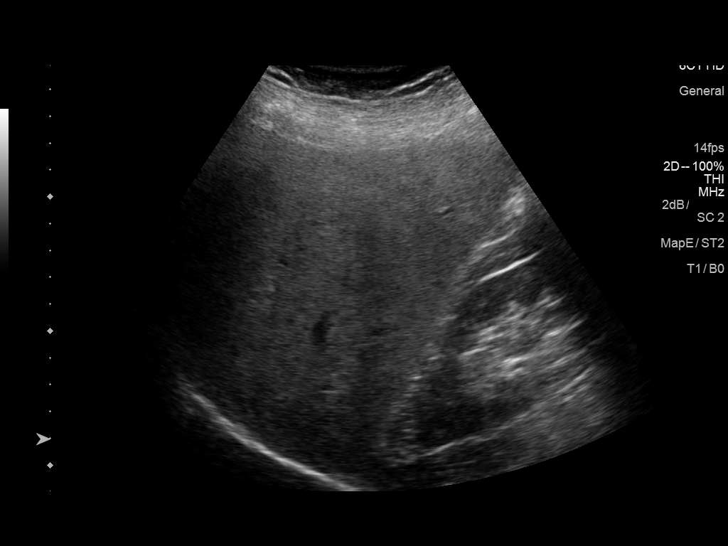
[im 32/35]
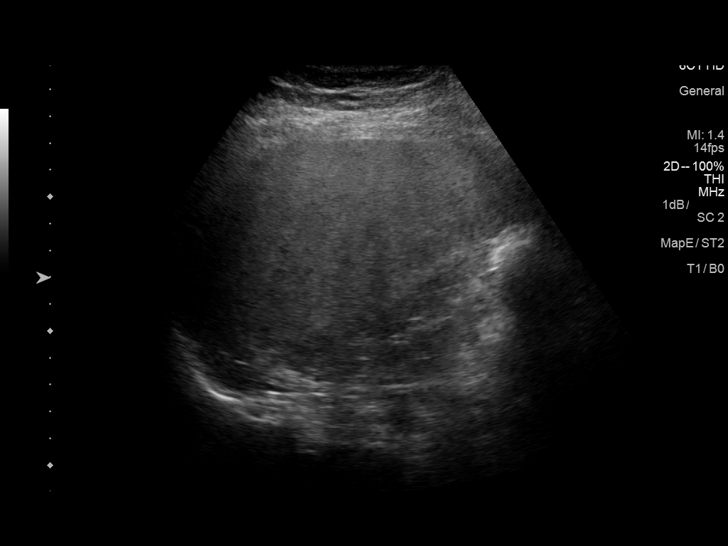
[im 35/35]
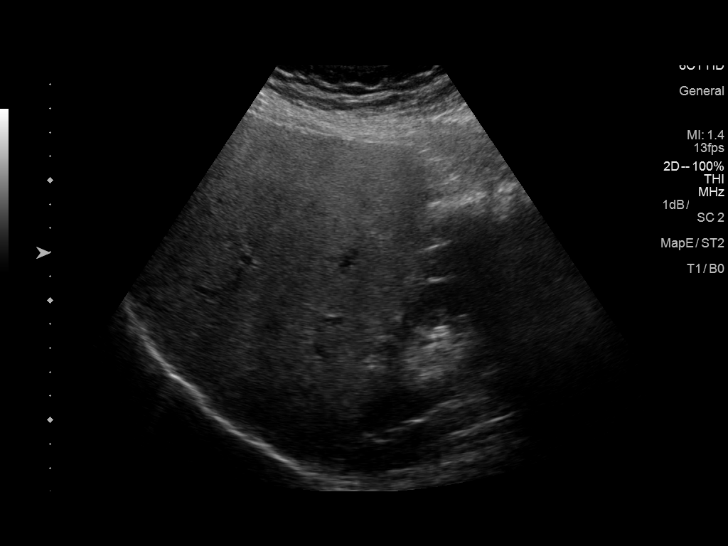

[14 of 25 positions shown; findings below may reference images not displayed]

FINDINGS: Gallbladder:

Cholecystectomy .

Common bile duct:

Diameter: 3.7 mm

Liver:

There is echogenic consistent fatty infiltration and/or
hepatocellular disease. Portal vein is patent on color Doppler
imaging with normal direction of blood flow towards the liver.
IMPRESSION: 1. Liver is echogenic consistent fatty infiltration and/or
hepatocellular disease.

2.  Cholecystectomy.  No biliary distention.

## 2019-08-11 ENCOUNTER — Other Ambulatory Visit: Payer: Self-pay

## 2019-08-11 ENCOUNTER — Ambulatory Visit (INDEPENDENT_AMBULATORY_CARE_PROVIDER_SITE_OTHER): Payer: Self-pay | Admitting: Family Medicine

## 2019-08-11 ENCOUNTER — Encounter: Payer: Self-pay | Admitting: Physician Assistant

## 2019-08-11 ENCOUNTER — Encounter: Payer: Self-pay | Admitting: Family Medicine

## 2019-08-11 VITALS — BP 112/66 | HR 74 | Temp 98.3°F | Ht <= 58 in | Wt 166.0 lb

## 2019-08-11 DIAGNOSIS — R197 Diarrhea, unspecified: Secondary | ICD-10-CM

## 2019-08-11 DIAGNOSIS — E119 Type 2 diabetes mellitus without complications: Secondary | ICD-10-CM

## 2019-08-11 DIAGNOSIS — E785 Hyperlipidemia, unspecified: Secondary | ICD-10-CM

## 2019-08-11 DIAGNOSIS — E1169 Type 2 diabetes mellitus with other specified complication: Secondary | ICD-10-CM

## 2019-08-11 LAB — POCT GLYCOSYLATED HEMOGLOBIN (HGB A1C): Hemoglobin A1C: 6.4 % — AB (ref 4.0–5.6)

## 2019-08-11 MED ORDER — ATORVASTATIN CALCIUM 20 MG PO TABS: 20.0000 mg | ORAL_TABLET | Freq: Every day | ORAL | 3 refills | Status: DC

## 2019-08-11 MED ORDER — METFORMIN HCL 500 MG PO TABS: 500.0000 mg | ORAL_TABLET | Freq: Every day | ORAL | 1 refills | Status: DC

## 2019-08-11 NOTE — Patient Instructions (Addendum)
    [facility] Address: [address], [place], [place] [address] Phone: [phone]  If you have lab work done today you will be contacted with your lab results within the next 2 weeks.  If you have not heard from us then please contact us. The fastest way to get your results is to register for My Chart.   IF you received an x-ray today, you will receive an invoice from [facility]. Please contact [facility] at [phone] with questions or concerns regarding your invoice.   IF you received labwork today, you will receive an invoice from [facility]. Please contact [facility] at [phone] with questions or concerns regarding your invoice.   Our billing staff will not be able to assist you with questions regarding bills from these companies.  You will be contacted with the lab results as soon as they are available. The fastest way to get your results is to activate your My Chart account. Instructions are located on the last page of this paperwork. If you have not heard from us regarding the results in 2 weeks, please contact this office.      

## 2019-08-11 NOTE — Progress Notes (Signed)
2/26/20211:36 PM  Bonnie Palmer 23-Nov-1970, 49 y.o., female [id number]  Chief Complaint  Patient presents with  . Diabetes    HPI:   Patient is a 49 y.o. female with past medical history significant for DM2 who presents today for routine followup  Last OV Aug 2020 - no changes She reports that she is doing well She checks her cbgs mostly fasting 110-115, occ will check later in the day 160-180s Taking metformin 553m once a day Denies any hypoglycemia She was exercising until about a month ago, had a fall, now recovered so planning on resuming She does try to eat smaller portions and avoid carbs  Reports intermittent bright red blood with diarrhea, first noted a year ago, has had total of 3 episodes, last about 10 days, not associated with any abd pain but does get cramps. nausea, vomiting, fever, chills, or sense malaise. No fhx IBD or colon cancer Non smoker  Lab Results  Component Value Date   HGBA1C 6.4 (A) 02/10/2019   HGBA1C 5.7 (A) 12/31/2017   HGBA1C 6.0 10/21/2017   Lab Results  Component Value Date   LDLCALC 114 (H) 02/10/2019   CREATININE 0.56 (L) 02/10/2019    Depression screen PHQ 2/9 08/11/2019 04/22/2017 02/19/2017  Decreased Interest 0 0 0  Down, Depressed, Hopeless 0 0 0  PHQ - 2 Score 0 0 0    Fall Risk  08/11/2019 04/22/2017 02/19/2017 01/11/2017 11/18/2016  Falls in the past year? 0 No No No No  Number falls in past yr: 0 - - - -  Injury with Fall? 0 - - - -     No Known Allergies  Prior to Admission medications   Medication Sig Start Date End Date Taking? Authorizing Provider  blood glucose meter kit and supplies KIT Check fasting blood sugar daily. 01/11/17  Yes MJaynee Eagles PA-C  metFORMIN (GLUCOPHAGE) 500 MG tablet Take 1 tablet (500 mg total) by mouth daily with breakfast. 02/10/19  Yes SRutherford Guys MD    Past Medical History:  Diagnosis Date  . Fatty liver   . Hyperlipidemia   . Pre-diabetes     Past Surgical History:    Procedure Laterality Date  . CHOLECYSTECTOMY  05/18/2012   Procedure: LAPAROSCOPIC CHOLECYSTECTOMY;  Surgeon: FStark Klein MD;  [place] [facility]  Service: General;  Laterality: N/A;    Social History   Tobacco Use  . Smoking status: Former Smoker    Types: Cigarettes  . Smokeless tobacco: Never Used  Substance Use Topics  . Alcohol use: Yes    Comment: 4-5 drinks a week - not in the same day    Family History  Problem Relation Age of Onset  . Diabetes Mother   . Diabetes Brother     Review of Systems  Constitutional: Negative for chills and fever.  Respiratory: Negative for cough and shortness of breath.   Cardiovascular: Negative for chest pain, palpitations and leg swelling.  Gastrointestinal: Negative for abdominal pain, nausea and vomiting.   Per hpi  OBJECTIVE:  Today's Vitals   08/11/19 1325  BP: 112/66  Pulse: 74  Temp: 98.3 F (36.8 C)  SpO2: 96%  Weight: 166 lb (75.3 kg)  Height: _0  (1.422 m)   Body mass index is 37.22 kg/m.  Wt Readings from Last 3 Encounters:  08/11/19 166 lb (75.3 kg)  02/10/19 165 lb (74.8 kg)  12/31/17 156 lb (70.8 kg)    Physical Exam Vitals and nursing note reviewed.  Constitutional:  Appearance: She is well-developed.  HENT:     Head: Normocephalic and atraumatic.     Mouth/Throat:     Pharynx: No oropharyngeal exudate.  Eyes:     General: No scleral icterus.    Conjunctiva/sclera: Conjunctivae normal.     Pupils: Pupils are equal, round, and reactive to light.  Cardiovascular:     Rate and Rhythm: Normal rate and regular rhythm.     Heart sounds: Normal heart sounds. No murmur. No friction rub. No gallop.   Pulmonary:     Effort: Pulmonary effort is normal.     Breath sounds: Normal breath sounds. No wheezing, rhonchi or rales.  Abdominal:     General: Bowel sounds are normal.     Palpations: Abdomen is soft. There is no hepatomegaly or splenomegaly.     Tenderness: There is abdominal tenderness in the  left lower quadrant. There is no guarding or rebound.  Musculoskeletal:     Cervical back: Neck supple.  Skin:    General: Skin is warm and dry.  Neurological:     Mental Status: She is alert and oriented to person, place, and time.     Results for orders placed or performed in visit on 08/11/19 (from the past 24 hour(s))  POCT A1C     Status: Abnormal   Collection Time: 08/11/19  1:54 PM  Result Value Ref Range   Hemoglobin A1C 6.4 (A) 4.0 - 5.6 %   HbA1c POC (<> result, manual entry)     HbA1c, POC (prediabetic range)     HbA1c, POC (controlled diabetic range)      No results found.   ASSESSMENT and PLAN  1. Controlled type 2 diabetes mellitus without complication, without long-term current use of insulin ([facility]) Controlled. Continue current regime.  - POCT A1C  2. Hyperlipidemia associated with type 2 diabetes mellitus ([place]) Starting atorvastatin, reviewed r/se/b  3. Bloody diarrhea Recurring nonpainful bloody diarrhea. Referring to GI for further eval and treat, as colonoscopy most likely needed to in self pay patient - Ambulatory referral to Gastroenterology  Other orders - atorvastatin (LIPITOR) 20 MG tablet; Take 1 tablet (20 mg total) by mouth daily. - metFORMIN (GLUCOPHAGE) 500 MG tablet; Take 1 tablet (500 mg total) by mouth daily with breakfast.  Return in about 6 months (around 02/08/2020).    [name], MD Primary Care at [place], [place] [address] Ph.  [phone] Fax [phone]

## 2019-08-25 ENCOUNTER — Encounter: Payer: Self-pay | Admitting: Physician Assistant

## 2019-08-25 ENCOUNTER — Other Ambulatory Visit: Payer: Self-pay

## 2019-08-25 ENCOUNTER — Ambulatory Visit (INDEPENDENT_AMBULATORY_CARE_PROVIDER_SITE_OTHER): Payer: Self-pay | Admitting: Physician Assistant

## 2019-08-25 VITALS — BP 110/72 | HR 84 | Temp 98.1°F | Ht <= 58 in | Wt 165.0 lb

## 2019-08-25 DIAGNOSIS — K6289 Other specified diseases of anus and rectum: Secondary | ICD-10-CM

## 2019-08-25 DIAGNOSIS — Z1211 Encounter for screening for malignant neoplasm of colon: Secondary | ICD-10-CM

## 2019-08-25 DIAGNOSIS — Z1212 Encounter for screening for malignant neoplasm of rectum: Secondary | ICD-10-CM

## 2019-08-25 DIAGNOSIS — K625 Hemorrhage of anus and rectum: Secondary | ICD-10-CM

## 2019-08-25 NOTE — Progress Notes (Signed)
I agree with the above note, plan 

## 2019-08-25 NOTE — Progress Notes (Signed)
Chief Complaint: "Bloody diarrhea"  HPI:    Bonnie Palmer is a 49 year old Hispanic female, who presents to clinic today with an interpreter and who was referred to me by [name], MD for a complaint of "bloody diarrhea".      08/11/2019 patient seen in clinic by her PCP for diabetes follow-up.  Apparently at that time reported intermittent bright red blood with diarrhea first noted a year ago, described a total of 3 episodes in the last was 10 days prior to being seen.  This is not associated with any abdominal pain but did get some cramping.  At that time was recommended that she see [place] and possibly have a colonoscopy.    Today, the patient presents clinic and explains that since the beginning of the year she has had 3 episodes of some bright red blood mixed in with her stool "just a little bit" typically on the toilet paper when wiping which will be there for 10 to 15 days and then go away and then come back.  Typically when she has bleeding she does have rectal pain which is described as sharp in nature.  This is worse with a bowel movement.  Denies diarrhea or constipation.    Denies nausea, vomiting, weight loss or change in bowel habits.  Past Medical History:  Diagnosis Date  . Fatty liver   . Hyperlipidemia   . Pre-diabetes     Past Surgical History:  Procedure Laterality Date  . CHOLECYSTECTOMY  05/18/2012   Procedure: LAPAROSCOPIC CHOLECYSTECTOMY;  Surgeon: Stark Klein, MD;  Location: [facility];  Service: General;  Laterality: N/A;    Current Outpatient Medications  Medication Sig Dispense Refill  . atorvastatin (LIPITOR) 20 MG tablet Take 1 tablet (20 mg total) by mouth daily. 90 tablet 3  . blood glucose meter kit and supplies KIT Check fasting blood sugar daily. 1 each 0  . metFORMIN (GLUCOPHAGE) 500 MG tablet Take 1 tablet (500 mg total) by mouth daily with breakfast. 90 tablet 1   No current facility-administered medications for this visit.    Allergies as of  08/25/2019  . (No Known Allergies)    Family History  Problem Relation Age of Onset  . Diabetes Mother   . Diabetes Brother     Social History   Socioeconomic History  . Marital status: Single    Spouse name: Not on file  . Number of children: Not on file  . Years of education: Not on file  . Highest education level: Not on file  Occupational History  . Not on file  Tobacco Use  . Smoking status: Former Smoker    Types: Cigarettes  . Smokeless tobacco: Never Used  Substance and Sexual Activity  . Alcohol use: Yes    Comment: 4-5 drinks a week - not in the same day  . Drug use: No  . Sexual activity: Not on file  Other Topics Concern  . Not on file  Social History Narrative  . Not on file   Social Determinants of Health   Financial Resource Strain:   . Difficulty of Paying Living Expenses:   Food Insecurity:   . Worried About Charity fundraiser in the Last Year:   . Arboriculturist in the Last Year:   Transportation Needs:   . Film/video editor (Medical):   Marland Kitchen Lack of Transportation (Non-Medical):   Physical Activity:   . Days of Exercise per Week:   . Minutes of  Exercise per Session:   Stress:   . Feeling of Stress :   Social Connections:   . Frequency of Communication with Friends and Family:   . Frequency of Social Gatherings with Friends and Family:   . Attends Religious Services:   . Active Member of Clubs or Organizations:   . Attends Archivist Meetings:   Marland Kitchen Marital Status:   Intimate Partner Violence:   . Fear of Current or Ex-Partner:   . Emotionally Abused:   Marland Kitchen Physically Abused:   . Sexually Abused:     Review of Systems:    Constitutional: No weight loss, fever or chills Skin: No rash Cardiovascular: No chest pain   Respiratory: No SOB Gastrointestinal: See HPI and otherwise negative Genitourinary: No dysuria  Neurological: No headache Musculoskeletal: No new muscle or joint pain Hematologic: No bruising Psychiatric:  No history of depression or anxiety   Physical Exam:  Vital signs: BP 110/72   Pulse 84   Temp 98.1 F (36.7 C)   Ht 4' 8"  (1.422 m)   Wt 165 lb (74.8 kg)   BMI 36.99 kg/m   Constitutional:   Pleasant overweight Hispanic female appears to be in NAD, Well developed, Well nourished, alert and cooperative Head:  Normocephalic and atraumatic. Eyes:   PEERL, EOMI. No icterus. Conjunctiva pink. Ears:  Normal auditory acuity. Neck:  Supple Throat: Oral cavity and pharynx without inflammation, swelling or lesion.  Respiratory: Respirations even and unlabored. Lungs clear to auscultation bilaterally.   No wheezes, crackles, or rhonchi.  Cardiovascular: Normal S1, S2. No MRG. Regular rate and rhythm. No peripheral edema, cyanosis or pallor.  Gastrointestinal:  Soft, nondistended, nontender. No rebound or guarding. Normal bowel sounds. No appreciable masses or hepatomegaly. Rectal:  Declined Msk:  Symmetrical without gross deformities. Without edema, no deformity or joint abnormality.  Neurologic:  Alert and  oriented x4;  grossly normal neurologically.  Skin:   Dry and intact without significant lesions or rashes. Psychiatric: Demonstrates good judgement and reason without abnormal affect or behaviors.  No recent labs/imaging.  Assessment: 1.  Rectal bleeding: Likely from a fissure as there is pain with the bleeding over the past 3 months, no family history of colon cancer; cannot rule out colorectal cancer or polyp 2.  Rectal pain: With above 3.  Screening for colorectal cancer: 49 years old and due for screening colonoscopy  Plan: 1.  Offered patient to do a rectal exam today but she tells me she is not having any symptoms currently and she does not think it be able to see anything. 2.  Discussed that due to episodes of bleeding and pain likely she has a fissure.  This was discussed.  She can try sitz bath's for 15 to 20 minutes 2-3 times a day when she is having the symptoms. 3.   Patient is over the age of 6 and due for screening for colorectal cancer, we could evaluate rectal bleeding further at that time too.  She would like to wait on this as she does not have insurance.  Provided her with information for [facility] assistance as well as the orange card.  She will call and let us know when she is ready to schedule colonoscopy. 4.  Patient was assigned to Dr. Ardis Hughs this afternoon.  She will follow with [place] after getting some assistance as above.  Ellouise Newer, PA-C [facility] 08/25/2019, 3:08 PM  Cc: [name], MD

## 2019-08-25 NOTE — Patient Instructions (Signed)
If you are age 48 or older, your body mass index should be between 23-30. Your Body mass index is 36.99 kg/m. If this is out of the aforementioned range listed, please consider follow up with your Primary Care Provider.  If you are age 17 or younger, your body mass index should be between 19-25. Your Body mass index is 36.99 kg/m. If this is out of the aformentioned range listed, please consider follow up with your Primary Care Provider.   Apply for [facility] patient assistance and/or Orange card.   Let us know when you get assistance and we will schedule colonoscopy.

## 2020-01-02 ENCOUNTER — Other Ambulatory Visit: Payer: Self-pay | Admitting: Family Medicine

## 2020-02-09 ENCOUNTER — Ambulatory Visit (INDEPENDENT_AMBULATORY_CARE_PROVIDER_SITE_OTHER): Payer: Self-pay | Admitting: Family Medicine

## 2020-02-09 ENCOUNTER — Other Ambulatory Visit: Payer: Self-pay

## 2020-02-09 ENCOUNTER — Encounter: Payer: Self-pay | Admitting: Family Medicine

## 2020-02-09 VITALS — BP 114/73 | HR 81 | Temp 98.6°F | Ht <= 58 in | Wt 169.0 lb

## 2020-02-09 DIAGNOSIS — E785 Hyperlipidemia, unspecified: Secondary | ICD-10-CM

## 2020-02-09 DIAGNOSIS — Z23 Encounter for immunization: Secondary | ICD-10-CM

## 2020-02-09 DIAGNOSIS — E1169 Type 2 diabetes mellitus with other specified complication: Secondary | ICD-10-CM

## 2020-02-09 DIAGNOSIS — E119 Type 2 diabetes mellitus without complications: Secondary | ICD-10-CM

## 2020-02-09 LAB — POCT GLYCOSYLATED HEMOGLOBIN (HGB A1C): Hemoglobin A1C: 6.8 % — AB (ref 4.0–5.6)

## 2020-02-09 MED ORDER — METFORMIN HCL 500 MG PO TABS: 500.0000 mg | ORAL_TABLET | Freq: Two times a day (BID) | ORAL | 1 refills | Status: AC

## 2020-02-09 NOTE — Progress Notes (Signed)
8/27/20212:19 PM  Bonnie Palmer 25-Oct-1970, 49 y.o., female [id number]  Chief Complaint  Patient presents with  . Diabetes    range 125- 160 after meals, no concerns reported. pt is self pay  . Hyperlipidemia    HPI:   Patient is a 49 y.o. female with past medical history significant for DM2 and HLP who presents today for routine followup   Last OV Feb 2021 -started atorvastatin  She is overall doing well Taking meds as rx wo issues Checking fasting cbg: 111, 120, highest 160s but this has been very rare Denies any hypoglycemia Denies any polyuria, polydipsia, blurry vision She continues to work on diet and exercise - had recent interruptions as was [place] the city Has completed covid vaccines   Lab Results  Component Value Date   HGBA1C 6.4 (A) 08/11/2019   HGBA1C 6.4 (A) 02/10/2019   HGBA1C 5.7 (A) 12/31/2017   Lab Results  Component Value Date   LDLCALC 114 (H) 02/10/2019   CREATININE 0.56 (L) 02/10/2019    Depression screen PHQ 2/9 02/09/2020 08/11/2019 04/22/2017  Decreased Interest 0 0 0  Down, Depressed, Hopeless 0 0 0  PHQ - 2 Score 0 0 0    Fall Risk  02/09/2020 08/11/2019 04/22/2017 02/19/2017 01/11/2017  Falls in the past year? 1 0 No No No  Number falls in past yr: 0 0 - - -  Injury with Fall? 0 0 - - -  Follow up Falls evaluation completed - - - -     No Known Allergies  Prior to Admission medications   Medication Sig Start Date End Date Taking? Authorizing Provider  atorvastatin (LIPITOR) 20 MG tablet Take 1 tablet (20 mg total) by mouth daily. 08/11/19  Yes [name], MD  blood glucose meter kit and supplies KIT Check fasting blood sugar daily. 01/11/17  Yes [name], PA-C  metFORMIN (GLUCOPHAGE) 500 MG tablet TAKE 1 TABLET(500 MG) BY MOUTH DAILY WITH BREAKFAST 01/02/20  Yes [name], MD    Past Medical History:  Diagnosis Date  . Diabetes mellitus without complication ([place])   . Fatty liver   . Hyperlipidemia   .  Pre-diabetes     Past Surgical History:  Procedure Laterality Date  . CHOLECYSTECTOMY  05/18/2012   Procedure: LAPAROSCOPIC CHOLECYSTECTOMY;  Surgeon: Stark Klein, MD;  Location: [facility];  Service: General;  Laterality: N/A;    Social History   Tobacco Use  . Smoking status: Former Smoker    Types: Cigarettes  . Smokeless tobacco: Never Used  Substance Use Topics  . Alcohol use: Yes    Comment: 4-5 drinks a week - not in the same day; as of 08/25/19 pt indicates she no longer drinks alcohol    Family History  Problem Relation Age of Onset  . Diabetes Mother   . Diabetes Brother   . Esophageal cancer Neg Hx   . Colon cancer Neg Hx   . Stomach cancer Neg Hx   . Pancreatic cancer Neg Hx   . Liver disease Neg Hx     Review of Systems  Constitutional: Negative for chills and fever.  Respiratory: Negative for cough and shortness of breath.   Cardiovascular: Negative for chest pain, palpitations and leg swelling.  Gastrointestinal: Negative for abdominal pain, nausea and vomiting.     OBJECTIVE:  Today's Vitals   02/09/20 1356  BP: 114/73  Pulse: 81  Temp: 98.6 F (37 C)  SpO2: 97%  Weight: 169 lb (  76.7 kg)  Height: _0  (1.422 m)   Body mass index is 37.89 kg/m.     Physical Exam Vitals and nursing note reviewed.  Constitutional:      Appearance: She is well-developed.  HENT:     Head: Normocephalic and atraumatic.     Mouth/Throat:     Pharynx: No oropharyngeal exudate.  Eyes:     General: No scleral icterus.    Extraocular Movements: Extraocular movements intact.     Conjunctiva/sclera: Conjunctivae normal.     Pupils: Pupils are equal, round, and reactive to light.  Cardiovascular:     Rate and Rhythm: Normal rate and regular rhythm.     Heart sounds: Normal heart sounds. No murmur heard.  No friction rub. No gallop.   Pulmonary:     Effort: Pulmonary effort is normal.     Breath sounds: Normal breath sounds. No wheezing, rhonchi or rales.    Musculoskeletal:     Cervical back: Neck supple.  Skin:    General: Skin is warm and dry.  Neurological:     Mental Status: She is alert and oriented to person, place, and time.     Results for orders placed or performed in visit on 02/09/20 (from the past 24 hour(s))  POCT glycosylated hemoglobin (Hb A1C)     Status: Abnormal   Collection Time: 02/09/20  2:35 PM  Result Value Ref Range   Hemoglobin A1C 6.8 (A) 4.0 - 5.6 %   HbA1c POC (<> result, manual entry)     HbA1c, POC (prediabetic range)     HbA1c, POC (controlled diabetic range)      No results found.   ASSESSMENT and PLAN  1. Controlled type 2 diabetes mellitus without complication, without long-term current use of insulin ([place]) While at goal her a1c is higher. 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 However she is she assigned to out of city jobs, will increase metformin to BID.  - POCT glycosylated hemoglobin (Hb A1C) - Lipid panel  2. Hyperlipidemia associated with type 2 diabetes mellitus ([place]) Checking labs today, medications will be adjusted as needed.  - Lipid panel - Comprehensive metabolic panel  3. Need for influenza vaccination  Other orders - Flu Vaccine QUAD 36+ mos IM - metFORMIN (GLUCOPHAGE) 500 MG tablet; Take 1 tablet (500 mg total) by mouth 2 (two) times daily with a meal.  Return in about 6 months (around 08/11/2020).    [name], MD Primary Care at [place], [place] [address] Ph.  [phone] Fax [phone]

## 2020-02-09 NOTE — Patient Instructions (Signed)
° ° ° °  If you have lab work done today you will be contacted with your lab results within the next 2 weeks.  If you have not heard from us then please contact us. The fastest way to get your results is to register for My Chart. ° ° °IF you received an x-ray today, you will receive an invoice from [facility]. Please contact [facility] at [phone] with questions or concerns regarding your invoice.  ° °IF you received labwork today, you will receive an invoice from [facility]. Please contact [facility] at [phone] with questions or concerns regarding your invoice.  ° °Our billing staff will not be able to assist you with questions regarding bills from these companies. ° °You will be contacted with the lab results as soon as they are available. The fastest way to get your results is to activate your My Chart account. Instructions are located on the last page of this paperwork. If you have not heard from us regarding the results in 2 weeks, please contact this office. °  ° ° ° °

## 2020-02-10 LAB — COMPREHENSIVE METABOLIC PANEL
ALT: 46 IU/L — ABNORMAL HIGH (ref 0–32)
AST: 32 IU/L (ref 0–40)
Albumin/Globulin Ratio: 1.3 (ref 1.2–2.2)
Albumin: 4.2 g/dL (ref 3.8–4.8)
Alkaline Phosphatase: 138 IU/L — ABNORMAL HIGH (ref 48–121)
BUN/Creatinine Ratio: 29 — ABNORMAL HIGH (ref 9–23)
BUN: 14 mg/dL (ref 6–24)
Bilirubin Total: <0.2 mg/dL (ref 0.0–1.2)
CO2: 24 mmol/L (ref 20–29)
Calcium: 9.2 mg/dL (ref 8.7–10.2)
Chloride: 101 mmol/L (ref 96–106)
Creatinine, Ser: 0.48 mg/dL — ABNORMAL LOW (ref 0.57–1.00)
GFR calc Af Amer: 134 mL/min/{1.73_m2} (ref >59)
GFR calc non Af Amer: 116 mL/min/{1.73_m2} (ref >59)
Globulin, Total: 3.2 g/dL (ref 1.5–4.5)
Glucose: 174 mg/dL — ABNORMAL HIGH (ref 65–99)
Potassium: 3.9 mmol/L (ref 3.5–5.2)
Sodium: 138 mmol/L (ref 134–144)
Total Protein: 7.4 g/dL (ref 6.0–8.5)

## 2020-02-10 LAB — LIPID PANEL
Chol/HDL Ratio: 3.2 ratio (ref 0.0–4.4)
Cholesterol, Total: 203 mg/dL — ABNORMAL HIGH (ref 100–199)
HDL: 63 mg/dL (ref >39)
LDL Chol Calc (NIH): 111 mg/dL — ABNORMAL HIGH (ref 0–99)
Triglycerides: 166 mg/dL — ABNORMAL HIGH (ref 0–149)
VLDL Cholesterol Cal: 29 mg/dL (ref 5–40)

## 2020-02-25 ENCOUNTER — Emergency Department (HOSPITAL_COMMUNITY)
Admission: EM | Admit: 2020-02-25 | Discharge: 2020-02-25 | Disposition: A | Discharge status: Home / Self Care | Payer: No Typology Code available for payment source | Attending: Emergency Medicine | Admitting: Emergency Medicine

## 2020-02-25 ENCOUNTER — Other Ambulatory Visit: Payer: Self-pay

## 2020-02-25 ENCOUNTER — Encounter (HOSPITAL_COMMUNITY): Payer: Self-pay | Admitting: Emergency Medicine

## 2020-02-25 DIAGNOSIS — Z5321 Procedure and treatment not carried out due to patient leaving prior to being seen by health care provider: Secondary | ICD-10-CM | POA: Insufficient documentation

## 2020-02-25 DIAGNOSIS — R109 Unspecified abdominal pain: Secondary | ICD-10-CM | POA: Insufficient documentation

## 2020-02-25 LAB — URINALYSIS, ROUTINE W REFLEX MICROSCOPIC
Bilirubin Urine: NEGATIVE
Glucose, UA: >=500 mg/dL — AB
Ketones, ur: NEGATIVE mg/dL
Leukocytes,Ua: NEGATIVE
Nitrite: NEGATIVE
Protein, ur: NEGATIVE mg/dL
Specific Gravity, Urine: 1.02 (ref 1.005–1.030)
pH: 5.5 (ref 5.0–8.0)

## 2020-02-25 LAB — BASIC METABOLIC PANEL
Anion gap: 8 (ref 5–15)
BUN: 13 mg/dL (ref 6–20)
CO2: 21 mmol/L — ABNORMAL LOW (ref 22–32)
Calcium: 9.3 mg/dL (ref 8.9–10.3)
Chloride: 106 mmol/L (ref 98–111)
Creatinine, Ser: 0.6 mg/dL (ref 0.44–1.00)
GFR calc Af Amer: >60 mL/min (ref >60)
GFR calc non Af Amer: >60 mL/min (ref >60)
Glucose, Bld: 216 mg/dL — ABNORMAL HIGH (ref 70–99)
Potassium: 3.7 mmol/L (ref 3.5–5.1)
Sodium: 135 mmol/L (ref 135–145)

## 2020-02-25 LAB — CBC
HCT: 40 % (ref 36.0–46.0)
Hemoglobin: 13 g/dL (ref 12.0–15.0)
MCH: 27.7 pg (ref 26.0–34.0)
MCHC: 32.5 g/dL (ref 30.0–36.0)
MCV: 85.3 fL (ref 80.0–100.0)
Platelets: 313 10*3/uL (ref 150–400)
RBC: 4.69 MIL/uL (ref 3.87–5.11)
RDW: 14.1 % (ref 11.5–15.5)
WBC: 9 10*3/uL (ref 4.0–10.5)
nRBC: 0 % (ref 0.0–0.2)

## 2020-02-25 LAB — I-STAT BETA HCG BLOOD, ED (MC, WL, AP ONLY): I-stat hCG, quantitative: <5 m[IU]/mL (ref <5)

## 2020-02-25 LAB — URINALYSIS, MICROSCOPIC (REFLEX)
Bacteria, UA: NONE SEEN
WBC, UA: NONE SEEN WBC/hpf (ref 0–5)

## 2020-02-25 NOTE — ED Notes (Signed)
Pt called x3 for rollcall with no response. °

## 2020-02-25 NOTE — ED Triage Notes (Signed)
C/o R flank pain x 10-11 days.  Denies urinary complaints.  No radiation to leg.

## 2020-05-14 ENCOUNTER — Ambulatory Visit (INDEPENDENT_AMBULATORY_CARE_PROVIDER_SITE_OTHER): Payer: Self-pay | Admitting: Emergency Medicine

## 2020-05-14 ENCOUNTER — Other Ambulatory Visit: Payer: Self-pay

## 2020-05-14 ENCOUNTER — Encounter: Payer: Self-pay | Admitting: Emergency Medicine

## 2020-05-14 VITALS — BP 121/78 | HR 70 | Temp 98.5°F | Resp 16 | Ht 58.27 in | Wt 172.4 lb

## 2020-05-14 DIAGNOSIS — Z6835 Body mass index (BMI) 35.0-35.9, adult: Secondary | ICD-10-CM

## 2020-05-14 DIAGNOSIS — E1169 Type 2 diabetes mellitus with other specified complication: Secondary | ICD-10-CM

## 2020-05-14 DIAGNOSIS — E1165 Type 2 diabetes mellitus with hyperglycemia: Secondary | ICD-10-CM

## 2020-05-14 DIAGNOSIS — Z7689 Persons encountering health services in other specified circumstances: Secondary | ICD-10-CM

## 2020-05-14 DIAGNOSIS — E785 Hyperlipidemia, unspecified: Secondary | ICD-10-CM

## 2020-05-14 LAB — GLUCOSE, POCT (MANUAL RESULT ENTRY): POC Glucose: 143 mg/dl — AB (ref 70–99)

## 2020-05-14 LAB — POCT GLYCOSYLATED HEMOGLOBIN (HGB A1C): Hemoglobin A1C: 7 % — AB (ref 4.0–5.6)

## 2020-05-14 NOTE — Assessment & Plan Note (Signed)
Hemoglobin A1c at 7.0 today.  Continue Metformin twice a day.  Restart atorvastatin 20 mg daily.  Diet and nutrition discussed. Follow-up in 3 months.

## 2020-05-14 NOTE — Progress Notes (Signed)
Bonnie Palmer 49 y.o.   Chief Complaint  Patient presents with   Transitions Of Care    former Dr Bonnie Palmer    Diabetes   Medication Refill    Metformin    HISTORY OF PRESENT ILLNESS: This is a 49 y.o. female here to establish care with me.  Used to see Dr. Pamella Palmer. Has history of diabetes.  On Metformin 500 mg twice a day. Was also taking atorvastatin 20 mg daily. Has no complaints or medical concerns today. Lab Results  Component Value Date   HGBA1C 6.8 (A) 02/09/2020    HPI   Prior to Admission medications   Medication Sig Start Date End Date Taking? Authorizing Provider  metFORMIN (GLUCOPHAGE) 500 MG tablet Take 1 tablet (500 mg total) by mouth 2 (two) times daily with a meal. 02/09/20  Yes [name], MD  atorvastatin (LIPITOR) 20 MG tablet Take 1 tablet (20 mg total) by mouth daily. Patient not taking: Reported on 05/14/2020 08/11/19   [name], MD  blood glucose meter kit and supplies KIT Check fasting blood sugar daily. 01/11/17   [name], PA-C    No Known Allergies  Patient Active Problem List   Diagnosis Date Noted   Hyperlipidemia associated with type 2 diabetes mellitus () 08/11/2019   Diabetes mellitus ([place]) 11/28/2018   Numbness and tingling of both feet 12/31/2017   Numbness and tingling in both hands 12/31/2017   Abnormal mammogram 02/28/2017   Smoker 11/18/2016    Past Medical History:  Diagnosis Date   Diabetes mellitus without complication (Bonnie Palmer)    Fatty liver    Hyperlipidemia    Pre-diabetes     Past Surgical History:  Procedure Laterality Date   CHOLECYSTECTOMY  05/18/2012   Procedure: LAPAROSCOPIC CHOLECYSTECTOMY;  Surgeon: Stark Klein, MD;  Location: [facility] OR;  Service: General;  Laterality: N/A;    Social History   Socioeconomic History   Marital status: Single    Spouse name: Not on file   Number of children: Not on file   Years of education: Not on file   Highest education level: Not on  file  Occupational History   Not on file  Tobacco Use   Smoking status: Former Smoker    Types: Cigarettes   Smokeless tobacco: Never Used  Substance and Sexual Activity   Alcohol use: Yes    Comment: 4-5 drinks a week - not in the same day; as of 08/25/19 pt indicates she no longer drinks alcohol   Drug use: No   Sexual activity: Not on file  Other Topics Concern   Not on file  Social History Narrative   Not on file   Social Determinants of Health   Financial Resource Strain:    Difficulty of Paying Living Expenses: Not on file  Food Insecurity:    Worried About Running Out of Food in the Last Year: Not on file   [employer] of Food in the Last Year: Not on file  Transportation Needs:    Lack of Transportation (Medical): Not on file   Lack of Transportation (Non-Medical): Not on file  Physical Activity:    Days of Exercise per Week: Not on file   Minutes of Exercise per Session: Not on file  Stress:    Feeling of Stress : Not on file  Social Connections:    Frequency of Communication with Friends and Family: Not on file   Frequency of Social Gatherings with Friends and Family: Not on file  Attends Religious Services: Not on file   Active Member of Clubs or Organizations: Not on file   Attends Archivist Meetings: Not on file   Marital Status: Not on file  Intimate Partner Violence:    Fear of Current or Ex-Partner: Not on file   Emotionally Abused: Not on file   Physically Abused: Not on file   Sexually Abused: Not on file    Family History  Problem Relation Age of Onset   Diabetes Mother    Diabetes Brother    Esophageal cancer Neg Hx    Colon cancer Neg Hx    Stomach cancer Neg Hx    Pancreatic cancer Neg Hx    Liver disease Neg Hx      Review of Systems  Constitutional: Negative.  Negative for chills and fever.  HENT: Negative.  Negative for congestion and sore throat.   Respiratory: Negative.  Negative for cough  and shortness of breath.   Cardiovascular: Negative.  Negative for chest pain and palpitations.  Gastrointestinal: Negative.  Negative for abdominal pain, blood in stool, diarrhea, melena, nausea and vomiting.  Genitourinary: Negative.  Negative for dysuria and hematuria.  Skin: Negative.  Negative for rash.  Neurological: Negative.  Negative for headaches.  All other systems reviewed and are negative.  Today's Vitals   05/14/20 1554  BP: 121/78  Pulse: 70  Resp: 16  Temp: 98.5 F (36.9 C)  TempSrc: Temporal  SpO2: 97%  Weight: 172 lb 6.4 oz (78.2 kg)  Height: 4' 10.27" (1.48 m)   Body mass index is 35.7 kg/m.   Physical Exam Vitals reviewed.  Constitutional:      Appearance: Normal appearance. She is obese.  HENT:     Head: Normocephalic.  Eyes:     Extraocular Movements: Extraocular movements intact.     Pupils: Pupils are equal, round, and reactive to light.  Cardiovascular:     Rate and Rhythm: Normal rate and regular rhythm.     Pulses: Normal pulses.     Heart sounds: Normal heart sounds.  Pulmonary:     Effort: Pulmonary effort is normal.     Breath sounds: Normal breath sounds.  Musculoskeletal:        General: Normal range of motion.     Cervical back: Normal range of motion and neck supple.  Skin:    General: Skin is warm and dry.     Capillary Refill: Capillary refill takes less than 2 seconds.  Neurological:     General: No focal deficit present.     Mental Status: She is alert and oriented to person, place, and time.  Psychiatric:        Mood and Affect: Mood normal.        Behavior: Behavior normal.    Results for orders placed or performed in visit on 05/14/20 (from the past 24 hour(s))  POCT glucose (manual entry)     Status: Abnormal   Collection Time: 05/14/20  4:28 PM  Result Value Ref Range   POC Glucose 143 (A) 70 - 99 mg/dl  POCT glycosylated hemoglobin (Hb A1C)     Status: Abnormal   Collection Time: 05/14/20  4:30 PM  Result Value  Ref Range   Hemoglobin A1C 7.0 (A) 4.0 - 5.6 %   HbA1c POC (<> result, manual entry)     HbA1c, POC (prediabetic range)     HbA1c, POC (controlled diabetic range)       ASSESSMENT & PLAN: Dyslipidemia associated  with type 2 diabetes mellitus (HCC) Hemoglobin A1c at 7.0 today.  Continue Metformin twice a day.  Restart atorvastatin 20 mg daily.  Diet and nutrition discussed. Follow-up in 3 months.  Irena was seen today for transitions of care, diabetes and medication refill.  Diagnoses and all orders for this visit:  Type 2 diabetes mellitus with hyperglycemia, without long-term current use of insulin (HCC) -     POCT glucose (manual entry) -     POCT glycosylated hemoglobin (Hb A1C) -     Ambulatory referral to Ophthalmology -     Microalbumin, urine  Encounter to establish care  Dyslipidemia associated with type 2 diabetes mellitus ([place])  Body mass index (BMI) of 35.0-35.9 in adult    Patient Instructions       If you have lab work done today you will be contacted with your lab results within the next 2 weeks.  If you have not heard from [place] then please contact us. The fastest way to get your results is to register for My Chart.   IF you received an x-ray today, you will receive an invoice from [facility]. Please contact [facility] at [phone] with questions or concerns regarding your invoice.   IF you received labwork today, you will receive an invoice from [facility]. Please contact [facility] at [phone] with questions or concerns regarding your invoice.   Our billing staff will not be able to assist you with questions regarding bills from these companies.  You will be contacted with the lab results as soon as they are available. The fastest way to get your results is to activate your My Chart account. Instructions are located on the last page of this paperwork. If you have not heard from [place] regarding the results in 2 weeks, please contact this  office.     Diabetes mellitus y nutricin, en adultos Diabetes Mellitus and Nutrition, Adult Si sufre de diabetes (diabetes mellitus), es muy importante tener hbitos alimenticios saludables debido a que sus niveles de Designer, television/film set sangre (glucosa) se ven afectados en gran medida por lo que come y bebe. [place] alimentos saludables en las cantidades [place], aproximadamente a la [employer], [place] ayudar a:  Aeronautical engineer glucemia.  Disminuir el riesgo de sufrir una enfermedad cardaca.  Mejorar la presin arterial.  Science writer o mantener un peso saludable. Todas las personas que sufren de diabetes son diferentes y cada una tiene necesidades diferentes en cuanto a un plan de alimentacin. El mdico puede recomendarle que trabaje con un especialista en dietas y nutricin (nutricionista) para Financial trader plan para usted. Su plan de alimentacin puede variar segn factores como:  Las caloras que necesita.  Los medicamentos que toma.  Su peso.  Sus niveles de glucemia, presin arterial y colesterol.  Su nivel de [place].  Otras afecciones que tenga, como enfermedades cardacas o renales. Cmo me afectan los carbohidratos? Los carbohidratos, o hidratos de carbono, afectan su nivel de glucemia ms que cualquier otro tipo de alimento. La ingesta de carbohidratos naturalmente aumenta la cantidad de [employer]. El recuento de carbohidratos es un mtodo destinado a Catering manager un registro de la cantidad de carbohidratos que se consumen. El recuento de carbohidratos es importante para Theatre manager la glucemia a un nivel saludable, especialmente si utiliza insulina o toma determinados medicamentos por va oral para la diabetes. Es importante conocer la cantidad de carbohidratos que se pueden ingerir en cada comida sin correr Engineer, manufacturing. Esto es [employer]  en cada persona. Su nutricionista puede ayudarlo a calcular la cantidad de carbohidratos que debe ingerir en cada comida y  en cada refrigerio. Entre los alimentos que contienen carbohidratos, se incluyen:  Pan, cereal, arroz, pastas y galletas.  Papas y maz.  Guisantes, frijoles y lentejas.  Leche y Estate agent.  Lambert Mody y [place].  Postres, como pasteles, galletas, helado y caramelos. Cmo me afecta el alcohol? El alcohol puede provocar disminuciones sbitas de la glucemia (hipoglucemia), especialmente si utiliza insulina o toma determinados medicamentos por va oral para la diabetes. La hipoglucemia es una afeccin potencialmente mortal. Los sntomas de la hipoglucemia (somnolencia, mareos y confusin) son similares a los sntomas de haber consumido demasiado alcohol. Si el mdico afirma que el alcohol es seguro para usted, [place] estas pautas:  Limite el consumo de alcohol a no ms de 4mdida por da si es mujer y no est eOdum y a 243midas si es hombre. Una medida equivale a 12oz (35517mde cerveza, 5oz (148m26me vino o 1oz (44ml49m bebidas alcohlicas de alta graduacin.  No beba con el estmago vaco.  Mantngase hidratado bebiendo agua, refrescos dietticos o t helado sin azcar.  Tenga en cuenta que los refrescos comunes, los jugos y otras bebida para mezclOptician, dispensingen contener mucha azcar y se deben contar como carbohidratos. Cules son algunos consejos para seguir este plan?  Leer las etiquetas de los alimentos  Comience por leer el tamao de la porcin en la Informacin nutricional en las etiquetas de los alimentos envasados y las bebidas. La cantidad de caloras, carbohidratos, grasas y otros nutrientes mencionados en la etiqueta se basan en una porcin del alimento. Muchos alimentos contienen ms de una porcin por envase.  Verifique la cantidad total de gramos (g) de carbohidratos totales en una porcin. Puede calcular la cantidad de porciones de carbohidratos al dividir el total de carbohidratos por 15. Por ejemplo, si un alimento tiene un total de 30g de carbohidratos, equivale a  2 porciones de carbohidratos.  Verifique la cantidad de gramos (g) de grasas saturadas y grasas trans en una porcin. Escoja alimentos que no contengan grasa o que tengan un bajo contenido.  Verifique la cantidad de miligramos (mg) de sal (sodio) en una porcin. La mayorState Farmas personas deben limitar la ingesta de sodio total a menos de 2300mg 52mda.  STraining and development officermpre consulte la informacin nutricional de los alimentos etiquetados como con bajo contenido de grasa [place] grasa. Estos alimentos pueden tener un mayor contenido de azcar Location managerada o carbohidratos refinados, y deben evitarse.  Hable con su nutricionista para identificar sus objetivos diarios en cuanto a los nutrientes mencionados en la etiqueta. Al ir de compras  Evite comprar alimentos procesados, enlatados o precocinados. Estos alimentos tienden a tener Special educational needs teacher cantidad de grasa,[place]rentono y azcar agregada.  Compre en la zona exterior de la tienda de comestibles. Esta zona incluye frutas y verduras frescas, granos a granel, carnes frescas y productos lcteos frescos. Al cocinar  Utilice mtodos de coccin a baja temperatura, como hornear, en lugar de mtodos de coccin a alta temperatura, como frer en abundante aceite.  Cocine con aceites saludables, como el aceite de oliva,[place] o girasoOrangete cocinar con manteca, crema o carnes con alto contenido de grasa. Planificacin de las comidas  Coma las comidas y los refrigerios regularmente, preferentemente a la misma hora todos los daNomae pasar largos perodos de tiempo sin comer.  Consuma alimentos ricos en fibra, como frutas frescas, verduras, frijoles y cereales integrales.  Consulte a su nutricionista sobre cuntas porciones de carbohidratos puede consumir en cada comida.  Consuma entre 4 y 6 onzas (oz) de protenas magras por da, como carnes [place], pollo, pescado, huevos o tofu. Una onza de protena magra equivale a: ? 1 onza de carne, pollo o  pescado. ? 1huevo. ?  taza de tofu.  Coma algunos alimentos por da que contengan grasas saludables, como aguacates, frutos secos, semillas y pescado. Estilo de vida  Controle su nivel de glucemia con regularidad.  Haga actividad fsica habitualmente como se lo haya indicado el mdico. Esto puede incluir lo siguiente: ? 117mnutos semanales de ejercicio de intensidad moderada o alta. Esto podra incluir caminatas dinmicas, ciclismo o gimnasia acutica. ? Realizar ejercicios de elongacin y de fortalecimiento, como yoga o levantamiento de pesas, por lo menos 2veces por semana.  Tome los mTenneco Incse lo haya indicado el mdico.  No consuma ningn producto que contenga nicotina o tabaco, como cigarrillos y cPsychologist, sport and exercise Si necesita ayuda para dejar de fumar, consulte al mHess Corporationcon un asesor o instructor en diabetes para identificar estrategias para controlar el estrs y cualquier desafo emocional y social. Preguntas para hacerle al mdico  Es necesario que consulte a uRadio broadcast assistanten el cuidado de la diabetes?  Es necesario que me rena con un nutricionista?  A qu nmero puedo llamar si tengo preguntas?  Cules son los mejores momentos para controlar la glucemia? Dnde encontrar ms informacin:  Asociacin Estadounidense de la Diabetes (American Diabetes Association): diabetes.org  Academia de Nutricin y DInformation systems manager(Academy of Nutrition and Dietetics): www.eatright.oFords PrairieDiabetes y las Enfermedades Digestivas y [place] ([facility] LPof Diabetes and Digestive and Kidney Diseases, NIH): wDesMoinesFuneral.dkResumen  Un plan de alimentacin saludable lo ayudar a cAeronautical engineerglucemia y mTheatre managerun estilo de vida saludable.  Trabajar con un especialista en dietas y nutricin (nutricionista) puede ayudarlo a eInsurance claims handlerde alimentacin para usted.  Tenga en cuenta que los carbohidratos (hidratos de  carbono) y el alcohol tienen efectos inmediatos en sus niveles de glucemia. Es importante contar los carbohidratos que ingiere y consumir alcohol con prudencia. Esta informacin no tiene cMarine scientistel consejo del mdico. Asegrese de hacerle al mdico cualquier pregunta que tenga. Document Revised: 02/09/2017 Document Reviewed: 09/21/2016 Elsevier Patient Education  2020 Elsevier Inc.      MAgustina Caroli MD Urgent MWestlakeGroup

## 2020-05-14 NOTE — Patient Instructions (Addendum)
   If you have lab work done today you will be contacted with your lab results within the next 2 weeks.  If you have not heard from us then please contact us. The fastest way to get your results is to register for My Chart.   IF you received an x-ray today, you will receive an invoice from [facility]. Please contact [facility] at [phone] with questions or concerns regarding your invoice.   IF you received labwork today, you will receive an invoice from [facility]. Please contact [facility] at [phone] with questions or concerns regarding your invoice.   Our billing staff will not be able to assist you with questions regarding bills from these companies.  You will be contacted with the lab results as soon as they are available. The fastest way to get your results is to activate your My Chart account. Instructions are located on the last page of this paperwork. If you have not heard from us regarding the results in 2 weeks, please contact this office.     Diabetes mellitus y nutricin, en adultos Diabetes Mellitus and Nutrition, Adult Si sufre de diabetes (diabetes mellitus), es muy importante tener hbitos alimenticios saludables debido a que sus niveles de azcar en la sangre (glucosa) se ven afectados en gran medida por lo que come y bebe. [place] alimentos saludables en las cantidades adecuadas, aproximadamente a la misma hora todos los das, lo ayudar a:  Controlar la glucemia.  Disminuir el riesgo de sufrir una enfermedad cardaca.  Mejorar la presin arterial.  Alcanzar o mantener un peso saludable. Todas las personas que sufren de diabetes son diferentes y cada una tiene necesidades diferentes en cuanto a un plan de alimentacin. El mdico puede recomendarle que trabaje con un especialista en dietas y nutricin (nutricionista) para elaborar el mejor plan para usted. Su plan de alimentacin puede variar segn factores como:  Las caloras que  necesita.  Los medicamentos que toma.  Su peso.  Sus niveles de glucemia, presin arterial y colesterol.  Su nivel de actividad.  Otras afecciones que tenga, como enfermedades cardacas o renales. Cmo me afectan los carbohidratos? Los carbohidratos, o hidratos de carbono, afectan su nivel de glucemia ms que cualquier otro tipo de alimento. La ingesta de carbohidratos naturalmente aumenta la cantidad de glucosa en la sangre. El recuento de carbohidratos es un mtodo destinado a llevar un registro de la cantidad de carbohidratos que se consumen. El recuento de carbohidratos es importante para mantener la glucemia a un nivel saludable, especialmente si utiliza insulina o toma determinados medicamentos por va oral para la diabetes. Es importante conocer la cantidad de carbohidratos que se pueden ingerir en cada comida sin correr ningn riesgo. Esto es diferente en cada persona. Su nutricionista puede ayudarlo a calcular la cantidad de carbohidratos que debe ingerir en cada comida y en cada refrigerio. Entre los alimentos que contienen carbohidratos, se incluyen:  Pan, cereal, arroz, pastas y galletas.  Papas y maz.  Guisantes, frijoles y lentejas.  Leche y yogur.  Frutas y jugo.  Postres, como pasteles, galletas, helado y caramelos. Cmo me afecta el alcohol? El alcohol puede provocar disminuciones sbitas de la glucemia (hipoglucemia), especialmente si utiliza insulina o toma determinados medicamentos por va oral para la diabetes. La hipoglucemia es una afeccin potencialmente mortal. Los sntomas de la hipoglucemia (somnolencia, mareos y confusin) son similares a los sntomas de haber consumido demasiado alcohol. Si el mdico afirma que el alcohol es seguro para usted, siga estas pautas:    Limite el consumo de alcohol a no ms de 1medida por da si es mujer y no est embarazada, y a 2medidas si es hombre. Una medida equivale a 12oz (355ml) de cerveza, 5oz (148ml) de vino o  1oz (44ml) de bebidas alcohlicas de alta graduacin.  No beba con el estmago vaco.  Mantngase hidratado bebiendo agua, refrescos dietticos o t helado sin azcar.  Tenga en cuenta que los refrescos comunes, los jugos y otras bebida para mezclar pueden contener mucha azcar y se deben contar como carbohidratos. Cules son algunos consejos para seguir este plan?  Leer las etiquetas de los alimentos  Comience por leer el tamao de la porcin en la "Informacin nutricional" en las etiquetas de los alimentos envasados y las bebidas. La cantidad de caloras, carbohidratos, grasas y otros nutrientes mencionados en la etiqueta se basan en una porcin del alimento. Muchos alimentos contienen ms de una porcin por envase.  Verifique la cantidad total de gramos (g) de carbohidratos totales en una porcin. Puede calcular la cantidad de porciones de carbohidratos al dividir el total de carbohidratos por 15. Por ejemplo, si un alimento tiene un total de 30g de carbohidratos, equivale a 2 porciones de carbohidratos.  Verifique la cantidad de gramos (g) de grasas saturadas y grasas trans en una porcin. Escoja alimentos que no contengan grasa o que tengan un bajo contenido.  Verifique la cantidad de miligramos (mg) de sal (sodio) en una porcin. La mayora de las personas deben limitar la ingesta de sodio total a menos de 2300mg por da.  Siempre consulte la informacin nutricional de los alimentos etiquetados como "con bajo contenido de grasa" o "sin grasa". Estos alimentos pueden tener un mayor contenido de azcar agregada o carbohidratos refinados, y deben evitarse.  Hable con su nutricionista para identificar sus objetivos diarios en cuanto a los nutrientes mencionados en la etiqueta. Al ir de compras  Evite comprar alimentos procesados, enlatados o precocinados. Estos alimentos tienden a tener una mayor cantidad de grasa, sodio y azcar agregada.  Compre en la zona exterior de la tienda de  comestibles. Esta zona incluye frutas y verduras frescas, granos a granel, carnes frescas y productos lcteos frescos. Al cocinar  Utilice mtodos de coccin a baja temperatura, como hornear, en lugar de mtodos de coccin a alta temperatura, como frer en abundante aceite.  Cocine con aceites saludables, como el aceite de oliva, canola o girasol.  Evite cocinar con manteca, crema o carnes con alto contenido de grasa. Planificacin de las comidas  Coma las comidas y los refrigerios regularmente, preferentemente a la misma hora todos los das. Evite pasar largos perodos de tiempo sin comer.  Consuma alimentos ricos en fibra, como frutas frescas, verduras, frijoles y cereales integrales. Consulte a su nutricionista sobre cuntas porciones de carbohidratos puede consumir en cada comida.  Consuma entre 4 y 6 onzas (oz) de protenas magras por da, como carnes magras, pollo, pescado, huevos o tofu. Una onza de protena magra equivale a: ? 1 onza de carne, pollo o pescado. ? 1huevo. ?  taza de tofu.  Coma algunos alimentos por da que contengan grasas saludables, como aguacates, frutos secos, semillas y pescado. Estilo de vida  Controle su nivel de glucemia con regularidad.  Haga actividad fsica habitualmente como se lo haya indicado el mdico. Esto puede incluir lo siguiente: ? 150minutos semanales de ejercicio de intensidad moderada o alta. Esto podra incluir caminatas dinmicas, ciclismo o gimnasia acutica. ? Realizar ejercicios de elongacin y de fortalecimiento, como yoga o levantamiento   de pesas, por lo menos 2veces por semana.  Tome los medicamentos como se lo haya indicado el mdico.  No consuma ningn producto que contenga nicotina o tabaco, como cigarrillos y cigarrillos electrnicos. Si necesita ayuda para dejar de fumar, consulte al mdico.  Trabaje con un asesor o instructor en diabetes para identificar estrategias para controlar el estrs y cualquier desafo emocional  y social. Preguntas para hacerle al mdico  Es necesario que consulte a un instructor en el cuidado de la diabetes?  Es necesario que me rena con un nutricionista?  A qu nmero puedo llamar si tengo preguntas?  Cules son los mejores momentos para controlar la glucemia? Dnde encontrar ms informacin:  Asociacin Estadounidense de la Diabetes (American Diabetes Association): diabetes.org  Academia de Nutricin y Diettica (Academy of Nutrition and Dietetics): www.eatright.org  Instituto Nacional de la Diabetes y las Enfermedades Digestivas y Renales (National Institute of Diabetes and Digestive and Kidney Diseases, NIH): www.niddk.nih.gov Resumen  Un plan de alimentacin saludable lo ayudar a controlar la glucemia y mantener un estilo de vida saludable.  Trabajar con un especialista en dietas y nutricin (nutricionista) puede ayudarlo a elaborar el mejor plan de alimentacin para usted.  Tenga en cuenta que los carbohidratos (hidratos de carbono) y el alcohol tienen efectos inmediatos en sus niveles de glucemia. Es importante contar los carbohidratos que ingiere y consumir alcohol con prudencia. Esta informacin no tiene como fin reemplazar el consejo del mdico. Asegrese de hacerle al mdico cualquier pregunta que tenga. Document Revised: 02/09/2017 Document Reviewed: 09/21/2016 Elsevier Patient Education  2020 Elsevier Inc.  

## 2020-05-15 LAB — MICROALBUMIN, URINE: Microalbumin, Urine: <3 ug/mL

## 2020-08-12 ENCOUNTER — Encounter: Payer: Self-pay | Admitting: Emergency Medicine

## 2020-08-12 ENCOUNTER — Other Ambulatory Visit: Payer: Self-pay

## 2020-08-12 ENCOUNTER — Ambulatory Visit (INDEPENDENT_AMBULATORY_CARE_PROVIDER_SITE_OTHER): Payer: Self-pay | Admitting: Emergency Medicine

## 2020-08-12 VITALS — BP 117/74 | HR 67 | Temp 98.2°F | Resp 16 | Ht <= 58 in | Wt 176.0 lb

## 2020-08-12 DIAGNOSIS — Z23 Encounter for immunization: Secondary | ICD-10-CM

## 2020-08-12 DIAGNOSIS — E1165 Type 2 diabetes mellitus with hyperglycemia: Secondary | ICD-10-CM

## 2020-08-12 DIAGNOSIS — Z1211 Encounter for screening for malignant neoplasm of colon: Secondary | ICD-10-CM

## 2020-08-12 DIAGNOSIS — E1169 Type 2 diabetes mellitus with other specified complication: Secondary | ICD-10-CM

## 2020-08-12 DIAGNOSIS — E785 Hyperlipidemia, unspecified: Secondary | ICD-10-CM

## 2020-08-12 LAB — POCT GLYCOSYLATED HEMOGLOBIN (HGB A1C): Hemoglobin A1C: 8.1 % — AB (ref 4.0–5.6)

## 2020-08-12 LAB — GLUCOSE, POCT (MANUAL RESULT ENTRY): POC Glucose: 141 mg/dl — AB (ref 70–99)

## 2020-08-12 MED ORDER — GLIPIZIDE 5 MG PO TABS: 5.0000 mg | ORAL_TABLET | Freq: Two times a day (BID) | ORAL | 3 refills | Status: AC

## 2020-08-12 NOTE — Patient Instructions (Addendum)
   If you have lab work done today you will be contacted with your lab results within the next 2 weeks.  If you have not heard from us then please contact us. The fastest way to get your results is to register for My Chart.   IF you received an x-ray today, you will receive an invoice from [facility]. Please contact [facility] at [phone] with questions or concerns regarding your invoice.   IF you received labwork today, you will receive an invoice from [facility]. Please contact [facility] at [phone] with questions or concerns regarding your invoice.   Our billing staff will not be able to assist you with questions regarding bills from these companies.  You will be contacted with the lab results as soon as they are available. The fastest way to get your results is to activate your My Chart account. Instructions are located on the last page of this paperwork. If you have not heard from us regarding the results in 2 weeks, please contact this office.     Diabetes mellitus y nutricin, en adultos Diabetes Mellitus and Nutrition, Adult Si sufre de diabetes, o diabetes mellitus, es muy importante tener hbitos alimenticios saludables debido a que sus niveles de azcar en la sangre (glucosa) se ven afectados en gran medida por lo que come y bebe. [place]er alimentos saludables en las cantidades correctas, aproximadamente a la misma hora todos los das, lo ayudar a:  Controlar la glucemia.  Disminuir el riesgo de sufrir una enfermedad cardaca.  Mejorar la presin arterial.  Alcanzar o mantener un peso saludable. Qu puede afectar mi plan de alimentacin? Todas las personas que sufren de diabetes son diferentes y cada una tiene necesidades diferentes en cuanto a un plan de alimentacin. El mdico puede recomendarle que trabaje con un nutricionista para elaborar el mejor plan para usted. Su plan de alimentacin puede variar segn factores como:  Las caloras que  necesita.  Los medicamentos que toma.  Su peso.  Sus niveles de glucemia, presin arterial y colesterol.  Su nivel de actividad.  Otras afecciones que tenga, como enfermedades cardacas o renales. Cmo me afectan los carbohidratos? Los carbohidratos, o hidratos de carbono, afectan su nivel de glucemia ms que cualquier otro tipo de alimento. La ingesta de carbohidratos naturalmente aumenta la cantidad de glucosa en la sangre. El recuento de carbohidratos es un mtodo destinado a llevar un registro de la cantidad de carbohidratos que se consumen. El recuento de carbohidratos es importante para mantener la glucemia a un nivel saludable, especialmente si utiliza insulina o toma determinados medicamentos por va oral para la diabetes. Es importante conocer la cantidad de carbohidratos que se pueden ingerir en cada comida sin correr ningn riesgo. Esto es diferente en cada persona. Su nutricionista puede ayudarlo a calcular la cantidad de carbohidratos que debe ingerir en cada comida y en cada refrigerio. Cmo me afecta el alcohol? El alcohol puede provocar disminuciones sbitas de la glucemia (hipoglucemia), especialmente si utiliza insulina o toma determinados medicamentos por va oral para la diabetes. La hipoglucemia es una afeccin potencialmente mortal. Los sntomas de la hipoglucemia, como somnolencia, mareos y confusin, son similares a los sntomas de haber consumido demasiado alcohol.  No beba alcohol si: ? Su mdico le indica no hacerlo. ? Est embarazada, puede estar embarazada o est tratando de quedar embarazada.  Si bebe alcohol: ? No beba con el estmago vaco. ? Limite la cantidad que bebe:  De 0 a 1 medida por da para las   mujeres.  De 0 a 2 medidas por da para los hombres. ? Est atento a la cantidad de alcohol que hay en las bebidas que toma. En [place], una medida equivale a una botella de cerveza de 12oz (355ml), un vaso de vino de 5oz (148ml) o un vaso  de una bebida alcohlica de alta graduacin de 1oz (44ml). ? Mantngase hidratado bebiendo agua, refrescos dietticos o t helado sin azcar.  Tenga en cuenta que los refrescos comunes, los jugos y otras bebida para mezclar pueden contener mucha azcar y se deben contar como carbohidratos. Consejos para seguir este plan Leer las etiquetas de los alimentos  Comience por leer el tamao de la porcin en la "Informacin nutricional" en las etiquetas de los alimentos envasados y las bebidas. La cantidad de caloras, carbohidratos, grasas y otros nutrientes mencionados en la etiqueta se basan en una porcin del alimento. Muchos alimentos contienen ms de una porcin por envase.  Verifique la cantidad total de gramos (g) de carbohidratos totales en una porcin. Puede calcular la cantidad de porciones de carbohidratos al dividir el total de carbohidratos por 15. Por ejemplo, si un alimento tiene un total de 30g de carbohidratos totales por porcin, equivale a 2 porciones de carbohidratos.  Verifique la cantidad de gramos (g) de grasas saturadas y grasas trans de una porcin. Escoja alimentos que no contengan estas grasas o que su contenido de estas sea bajo.  Verifique la cantidad de miligramos (mg) de sal (sodio) en una porcin. La mayora de las personas deben limitar la ingesta de sodio total a menos de 2300mg por da.  Siempre consulte la informacin nutricional de los alimentos etiquetados como "con bajo contenido de grasa" o "sin grasa". Estos alimentos pueden tener un mayor contenido de azcar agregada o carbohidratos refinados, y deben evitarse.  Hable con su nutricionista para identificar sus objetivos diarios en cuanto a los nutrientes mencionados en la etiqueta. Al ir de compras  Evite comprar alimentos procesados, enlatados o precocidos. Estos alimentos tienden a tener una mayor cantidad de grasa, sodio y azcar agregada.  Compre en la zona exterior de la tienda de comestibles. Esta es  la zona donde se encuentran con mayor frecuencia las frutas y las verduras frescas, los cereales a granel, las carnes frescas y los productos lcteos frescos. Al cocinar  Utilice mtodos de coccin a baja temperatura, como hornear, en lugar de mtodos de coccin a alta temperatura, como frer en abundante aceite.  Cocine con aceites saludables, como el aceite de oliva, canola o girasol.  Evite cocinar con manteca, crema o carnes con alto contenido de grasa. Planificacin de las comidas  Coma las comidas y los refrigerios regularmente, preferentemente a la misma hora todos los das. Evite pasar largos perodos de tiempo sin comer.  Consuma alimentos ricos en fibra, como frutas frescas, verduras, frijoles y cereales integrales. Consulte a su nutricionista sobre cuntas porciones de carbohidratos puede consumir en cada comida.  Consuma entre 4 y 6 onzas (entre 112 y 168g) de protenas magras por da, como carnes magras, pollo, pescado, huevos o tofu. Una onza (oz) de protena magra equivale a: ? 1 onza (28g) de carne, pollo o pescado. ? 1huevo. ?  de taza (62 g) de tofu.  Coma algunos alimentos por da que contengan grasas saludables, como aguacates, frutos secos, semillas y pescado.   Qu alimentos debo comer? Frutas Bayas. Manzanas. Naranjas. Duraznos. Damascos. Ciruelas. Uvas. Mango. Papaya. Granada. Kiwi. Cerezas. Verduras Lechuga. Espinaca. Verduras de hoja verde, que incluyen   col rizada, acelga, hojas de berza y de mostaza. Remolachas. Coliflor. Repollo. Brcoli. Zanahorias. Judas verdes. Tomates. Pimientos. Cebollas. Pepinos. Coles de Bruselas. Granos Granos integrales, como panes, galletas, tortillas, cereales y pastas de salvado o integrales. Avena sin azcar. Quinua. Arroz integral o salvaje. Carnes y otras protenas Mariscos. Carne de ave sin piel. Cortes magros de ave y carne de res. Tofu. Frutos secos. Semillas. Lcteos Productos lcteos sin grasa o con bajo contenido de  grasa, como leche, yogur y queso. Es posible que los productos que se enumeran ms arriba no constituyan una lista completa de los alimentos y las bebidas que puede tomar. Consulte a un nutricionista para obtener ms informacin. Qu alimentos debo evitar? Frutas Frutas enlatadas al almbar. Verduras Verduras enlatadas. Verduras congeladas con mantequilla o salsa de crema. Granos Productos elaborados con harina y harina blanca refinada, como panes, pastas, bocadillos y cereales. Evite todos los alimentos procesados. Carnes y otras protenas Cortes de carne con alto contenido de grasa. Carne de ave con piel. Carnes empanizadas o fritas. Carne procesada. Evite las grasas saturadas. Lcteos Yogur, queso o leche enteros. Bebidas Bebidas azucaradas, como gaseosas o t helado. Es posible que los productos que se enumeran ms arriba no constituyan una lista completa de los alimentos y las bebidas que debe evitar. Consulte a un nutricionista para obtener ms informacin. Preguntas para hacerle al mdico  Es necesario que me rena con un instructor en el cuidado de la diabetes?  Es necesario que me rena con un nutricionista?  A qu nmero puedo llamar si tengo preguntas?  Cules son los mejores momentos para controlar la glucemia? Dnde encontrar ms informacin:  Asociacin Estadounidense de la Diabetes (American Diabetes Association): diabetes.org  Academy of Nutrition and Dietetics (Academia de Nutricin y Diettica): www.eatright.org  National Institute of Diabetes and Digestive and Kidney Diseases (Instituto Nacional de la Diabetes y las Enfermedades Digestivas y Renales): www.niddk.nih.gov  Association of Diabetes Care and Education Specialists (Asociacin de Especialistas en Atencin y Educacin sobre la Diabetes): www.diabeteseducator.org Resumen  Es importante tener hbitos alimenticios saludables debido a que sus niveles de azcar en la sangre (glucosa) se ven afectados en  gran medida por lo que come y bebe.  Un plan de alimentacin saludable lo ayudar a controlar la glucemia y mantener un estilo de vida saludable.  El mdico puede recomendarle que trabaje con un nutricionista para elaborar el mejor plan para usted.  Tenga en cuenta que los carbohidratos (hidratos de carbono) y el alcohol tienen efectos inmediatos en sus niveles de glucemia. Es importante contar los carbohidratos que ingiere y consumir alcohol con prudencia. Esta informacin no tiene como fin reemplazar el consejo del mdico. Asegrese de hacerle al mdico cualquier pregunta que tenga. Document Revised: 07/06/2019 Document Reviewed: 07/06/2019 Elsevier Patient Education  2021 Elsevier Inc.  

## 2020-08-12 NOTE — Assessment & Plan Note (Signed)
Uncontrolled diabetes with hemoglobin A1c at 8.1. Continue Metformin 500 mg twice a day and start glipizide 5 mg twice a day. Diet and nutrition discussed. Follow-up in 3 months. Continue atorvastatin 20 mg daily.

## 2020-08-12 NOTE — Progress Notes (Signed)
Bonnie Palmer 50 y.o.   Chief Complaint  Patient presents with  . Diabetes    Follow up 3 month    HISTORY OF PRESENT ILLNESS: This is a 50 y.o. female with history of diabetes and dyslipidemia on Metformin and atorvastatin here for follow-up and medication refill. No significant changes with her diet. No complaints or medical concerns today.  HPI   Prior to Admission medications   Medication Sig Start Date End Date Taking? Authorizing Provider  atorvastatin (LIPITOR) 20 MG tablet Take 1 tablet (20 mg total) by mouth daily. 08/11/19  Yes Jacelyn Pi, Lilia Argue, MD  metFORMIN (GLUCOPHAGE) 500 MG tablet Take 1 tablet (500 mg total) by mouth 2 (two) times daily with a meal. 02/09/20  Yes Jacelyn Pi, Irma M, MD  blood glucose meter kit and supplies KIT Check fasting blood sugar daily. 01/11/17   [name], PA-C    No Known Allergies  Patient Active Problem List   Diagnosis Date Noted  . Hyperlipidemia associated with type 2 diabetes mellitus ([place]) 08/11/2019  . Dyslipidemia associated with type 2 diabetes mellitus ([place]) 11/28/2018  . Numbness and tingling of both feet 12/31/2017  . Numbness and tingling in both hands 12/31/2017  . Abnormal mammogram 02/28/2017  . Smoker 11/18/2016    Past Medical History:  Diagnosis Date  . Diabetes mellitus without complication ([place])   . Fatty liver   . Hyperlipidemia   . Pre-diabetes     Past Surgical History:  Procedure Laterality Date  . CHOLECYSTECTOMY  05/18/2012   Procedure: LAPAROSCOPIC CHOLECYSTECTOMY;  Surgeon: Stark Klein, MD;  Location: [facility] OR;  Service: General;  Laterality: N/A;    Social History   Socioeconomic History  . Marital status: Single    Spouse name: Not on file  . Number of children: Not on file  . Years of education: Not on file  . Highest education level: Not on file  Occupational History  . Not on file  Tobacco Use  . Smoking status: Former Smoker    Types: Cigarettes  . Smokeless tobacco:  Never Used  Substance and Sexual Activity  . Alcohol use: Yes    Comment: 4-5 drinks a week - not in the same day; as of 08/25/19 pt indicates she no longer drinks alcohol  . Drug use: No  . Sexual activity: Not on file  Other Topics Concern  . Not on file  Social History Narrative  . Not on file   Social Determinants of Health   Financial Resource Strain: Not on file  Food Insecurity: Not on file  Transportation Needs: Not on file  Physical Activity: Not on file  Stress: Not on file  Social Connections: Not on file  Intimate Partner Violence: Not on file    Family History  Problem Relation Age of Onset  . Diabetes Mother   . Diabetes Brother   . Esophageal cancer Neg Hx   . Colon cancer Neg Hx   . Stomach cancer Neg Hx   . Pancreatic cancer Neg Hx   . Liver disease Neg Hx      Review of Systems  Constitutional: Negative.  Negative for chills and fever.  HENT: Negative.  Negative for congestion and sore throat.   Respiratory: Negative.  Negative for cough and shortness of breath.   Cardiovascular: Negative.  Negative for chest pain and palpitations.  Gastrointestinal: Negative for abdominal pain, diarrhea, nausea and vomiting.  Genitourinary: Negative.  Negative for dysuria and frequency.  Musculoskeletal: Negative.  Skin: Negative.  Negative for rash.  Neurological: Negative for dizziness and headaches.  All other systems reviewed and are negative.   Today's Vitals   08/12/20 1514  BP: 117/74  Pulse: 67  Resp: 16  Temp: 98.2 F (36.8 C)  TempSrc: Temporal  SpO2: 96%  Weight: 176 lb (79.8 kg)  Height: 4' 8.5" (1.435 m)   Body mass index is 38.76 kg/m. Wt Readings from Last 3 Encounters:  08/12/20 176 lb (79.8 kg)  05/14/20 172 lb 6.4 oz (78.2 kg)  02/09/20 169 lb (76.7 kg)    Physical Exam Vitals reviewed.  Constitutional:      Appearance: She is obese.  HENT:     Head: Normocephalic.  Eyes:     Extraocular Movements: Extraocular movements  intact.     Pupils: Pupils are equal, round, and reactive to light.  Cardiovascular:     Rate and Rhythm: Normal rate.     Pulses: Normal pulses.     Heart sounds: Normal heart sounds.  Pulmonary:     Effort: Pulmonary effort is normal.     Breath sounds: Normal breath sounds.  Musculoskeletal:        General: Normal range of motion.     Cervical back: Normal range of motion and neck supple.  Skin:    General: Skin is warm and dry.     Capillary Refill: Capillary refill takes less than 2 seconds.  Neurological:     General: No focal deficit present.     Mental Status: She is alert and oriented to person, place, and time.  Psychiatric:        Mood and Affect: Mood normal.        Behavior: Behavior normal.     Results for orders placed or performed in visit on 08/12/20 (from the past 24 hour(s))  POCT glucose (manual entry)     Status: Abnormal   Collection Time: 08/12/20  3:42 PM  Result Value Ref Range   POC Glucose 141 (A) 70 - 99 mg/dl  POCT glycosylated hemoglobin (Hb A1C)     Status: Abnormal   Collection Time: 08/12/20  3:48 PM  Result Value Ref Range   Hemoglobin A1C 8.1 (A) 4.0 - 5.6 %   HbA1c POC (<> result, manual entry)     HbA1c, POC (prediabetic range)     HbA1c, POC (controlled diabetic range)      ASSESSMENT & PLAN: Dyslipidemia associated with type 2 diabetes mellitus ([place]) Uncontrolled diabetes with hemoglobin A1c at 8.1. Continue Metformin 500 mg twice a day and start glipizide 5 mg twice a day. Diet and nutrition discussed. Follow-up in 3 months. Continue atorvastatin 20 mg daily.  Bonnie Palmer was seen today for diabetes.  Diagnoses and all orders for this visit:  Dyslipidemia associated with type 2 diabetes mellitus ([place]) -     POCT glucose (manual entry) -     POCT glycosylated hemoglobin (Hb A1C) -     CMP14+EGFR -     Lipid panel -     HM Diabetes Foot Exam  Need for Tdap vaccination -     Cancel: Tdap vaccine greater than or equal to 7yo  IM  Screening for colon cancer -     Ambulatory referral to Gastroenterology  Uncontrolled type 2 diabetes mellitus with hyperglycemia ([place]) -     glipiZIDE (GLUCOTROL) 5 MG tablet; Take 1 tablet (5 mg total) by mouth 2 (two) times daily before a meal.    Patient Instructions  If you have lab work done today you will be contacted with your lab results within the next 2 weeks.  If you have not heard from [place] then please contact us. The fastest way to get your results is to register for My Chart.   IF you received an x-ray today, you will receive an invoice from [facility]. Please contact [facility] Radiology at [phone] with questions or concerns regarding your invoice.   IF you received labwork today, you will receive an invoice from [facility]. Please contact [facility] at [phone] with questions or concerns regarding your invoice.   Our billing staff will not be able to assist you with questions regarding bills from these companies.  You will be contacted with the lab results as soon as they are available. The fastest way to get your results is to activate your My Chart account. Instructions are located on the last page of this paperwork. If you have not heard from [place] regarding the results in 2 weeks, please contact this office.     Diabetes mellitus y nutricin, en adultos Diabetes Mellitus and Nutrition, Adult Si sufre de diabetes, o diabetes mellitus, es muy importante tener hbitos alimenticios saludables debido a que sus niveles de Designer, television/film set sangre (glucosa) se ven afectados en gran medida por lo que come y bebe. Comer alimentos saludables en las cantidades correctas, aproximadamente a la misma hora todos los [place], [place] ayudar a:  Aeronautical engineer glucemia.  Disminuir el riesgo de sufrir una enfermedad cardaca.  Mejorar la presin arterial.  Science writer o mantener un peso saludable. Qu puede afectar mi plan de alimentacin? Todas las personas que sufren  de diabetes son diferentes y cada una tiene necesidades diferentes en cuanto a un plan de alimentacin. El mdico puede recomendarle que trabaje con un nutricionista para elaborar el mejor plan para usted. Su plan de alimentacin puede variar segn factores como:  Las caloras que necesita.  Los medicamentos que toma.  Su peso.  Sus niveles de glucemia, presin arterial y colesterol.  Su nivel de [place].  Otras afecciones que tenga, como enfermedades cardacas o renales. Cmo me afectan los carbohidratos? Los carbohidratos, o hidratos de carbono, afectan su nivel de glucemia ms que cualquier otro tipo de alimento. La ingesta de carbohidratos naturalmente aumenta la cantidad de [employer]. El recuento de carbohidratos es un mtodo destinado a Catering manager un registro de la cantidad de carbohidratos que se consumen. El recuento de carbohidratos es importante para Theatre manager la glucemia a un nivel saludable, especialmente si utiliza insulina o toma determinados medicamentos por va oral para la diabetes. Es importante conocer la cantidad de carbohidratos que se pueden ingerir en cada comida sin correr Engineer, manufacturing. Esto es Psychologist, forensic. Su nutricionista puede ayudarlo a calcular la cantidad de carbohidratos que debe ingerir en cada comida y en cada refrigerio. Cmo me afecta el alcohol? El alcohol puede provocar disminuciones sbitas de la glucemia (hipoglucemia), especialmente si utiliza insulina o toma determinados medicamentos por va oral para la diabetes. La hipoglucemia es una afeccin potencialmente mortal. Los sntomas de la hipoglucemia, como somnolencia, mareos y confusin, son similares a los sntomas de haber consumido demasiado alcohol.  No beba alcohol si: ? Su mdico le indica no hacerlo. ? Est embarazada, puede estar embarazada o est tratando de quedar embarazada.  Si bebe alcohol: ? No beba con el estmago vaco. ? Limite la cantidad que bebe:  De 0 a  1 medida por da para las mujeres.  De 0 a 2 medidas por da para los hombres. ? Est atento a la cantidad de alcohol que hay en las bebidas que toma. En los [place], una medida equivale a una botella de cerveza de 12oz (367m), un vaso de vino de 5oz (1479m o un vaso de una bebida alcohlica de alta graduacin de 1oz (442m ? Mantngase hidratado bebiendo agua, refrescos dietticos o t helado sin azcar.  Tenga en cuenta que los refrescos comunes, los jugos y otras bebida para mezOptician, dispensingeden contener mucha azcar y se deben contar como carbohidratos. Consejos para seguir estPhotographers etiquetas de los alimentos  Comience por leer el tamao de la porcin en la "Informacin nutricional" en las etiquetas de los alimentos envasados y las bebidas. La cantidad de caloras, carbohidratos, grasas y otros nutrientes mencionados en la etiqueta se basan en una porcin del alimento. Muchos alimentos contienen ms de una porcin por envase.  Verifique la cantidad total de gramos (g) de carbohidratos totales en una porcin. Puede calcular la cantidad de porciones de carbohidratos al dividir el total de carbohidratos por 15. Por ejemplo, si un alimento tiene un total de 30g de carbohidratos totales por porcin, equivale a 2 porciones de carbohidratos.  Verifique la cantidad de gramos (g) de grasas saturadas y grasas trans de una porcin. Escoja alimentos que no contengan estas grasas o que su contenido de estas sea bajOak HarborVerifique la cantidad de miligramos (mg) de sal (sodio) en una porcin. La mayState Farm las personas deben limitar la ingesta de sodio total a menos de 2300m48mr da. Training and development officeriempre consulte la informacin nutricional de los alimentos etiquetados como "con bajo contenido de grasa" o "sin grasa". Estos alimentos pueden tener un mayor contenido de azcaLocation manageregada o carbohidratos refinados, y deben evitarse.  Hable con su nutricionista para identificar sus objetivos diarios en  cuanto a los nutrientes mencionados en la etiqueta. Al ir de compras  Evite comprar alimentos procesados, enlatados o precocidos. Estos alimentos tienden a teneSpecial educational needs teacheror cantidad de grasSouth Pittsburgdio y azcar agregada.  Compre en la zona exterior de la tienda de comestibles. Esta es la zona donde se encuentran con mayor frecuencia las frutas y las verduras frescas, los cereales a granel, las carnes frescas y los productos lcteos frescos. Al cocinar  Utilice mtodos de coccin a baja temperatura, como hornear, en lugar de mtodos de coccin a alta temperatura, como frer en abundante aceite.  Cocine con aceites saludables, como el aceite de olivUmber View Heightsnola o giraEaton Rapidsvite cocinar con manteca, crema o carnes con alto contenido de grasa. Planificacin de las comidas  Coma las comidas y los refrigerios regularmente, preferentemente a la misma hora todos los [place]awayite pasar largos perodos de tiempo sin comer.  Consuma alimentos ricos en fibra, como frutas frescas, verduras, frijoles y cereales integrales. Consulte a su nutricionista sobre cuntas porciones de carbohidratos puede consumir en cada comida.  Consuma entre 4 y 6 onzas (entre 112 y 168g) de protenas magras por da, como carnes magras, pollo, pescado, huevos o tofu. Una onza (oz) de protena magra equivale a: ? 1 onza (28g) de carne, pollo o pescado. ? 1huevo. ?  de taza (62 g) de tofu.  Coma algunos alimentos por da que contengan grasas saludables, como aguacates, frutos secos, semillas y pescado.   Qu alimentos debo comer? FrutLambert Modyas. Manzanas. Naranjas. Duraznos. Damascos. Ciruelas. Uvas. Mango. Papaya. GranLesliewi. Cerezas. VerdHolland CommonshValeda Malmpinaca. Verduras de hojaBoeinge incluyen col rizada, acelLa Paz  hojas de [place] y [place]. Remolachas. Coliflor. Repollo. Brcoli. Zanahorias. Judas verdes. Tomates. Pimientos. Cebollas. Pepinos. Coles de Bruselas. Granos Granos integrales, como panes, galletas,  tortillas, cereales y pastas de salvado o integrales. Avena sin azcar. Quinua. Arroz integral o salvaje. Carnes y Psychiatric nurse. Carne de ave sin piel. Cortes magros de ave y carne de res. Tofu. Frutos secos. Semillas. Lcteos Productos lcteos sin grasa o con bajo contenido de [place], [place], yogur y [place]. Es posible que los productos que se enumeran ms [place] no constituyan una lista completa de los alimentos y las bebidas que puede tomar. Consulte a un nutricionista para obtener ms informacin. Qu alimentos debo evitar? Lambert Mody Frutas enlatadas al almbar. Verduras Verduras enlatadas. Verduras congeladas con mantequilla o salsa de crema. Granos Productos elaborados con [place] y [place], como panes, pastas, bocadillos y cereales. Evite todos los alimentos procesados. Carnes y otras protenas Cortes de carne con alto contenido de Lobbyist. Carne de ave con piel. Carnes empanizadas o fritas. Carne procesada. Evite las grasas saturadas. Lcteos Yogur, Darby enteros. Bebidas Bebidas azucaradas, como gaseosas o t helado. Es posible que los productos que se enumeran ms [place] no constituyan una lista completa de los alimentos y las bebidas que Nurse, adult. Consulte a un nutricionista para obtener ms informacin. Preguntas para hacerle al mdico  Es necesario que me rena con Radio broadcast assistant en el cuidado de la diabetes?  Es necesario que me rena con un nutricionista?  A qu nmero puedo llamar si tengo preguntas?  Cules son los mejores momentos para controlar la glucemia? Dnde encontrar ms informacin:  Asociacin Estadounidense de la Diabetes (American Diabetes Association): diabetes.org  Academy of Nutrition and Dietetics (Academia de Nutricin y Information systems manager): www.eatright.[employer] of Diabetes and Digestive and Kidney Diseases ([place] la Diabetes y [place] y [place]):  DesMoinesFuneral.dk  Association of Diabetes Care and Education Specialists (Asociacin de Especialistas en Atencin y Educacin sobre la Diabetes): www.diabeteseducator.org Resumen  Es importante tener hbitos alimenticios saludables debido a que sus niveles de Designer, television/film set sangre (glucosa) se ven afectados en gran medida por lo que come y bebe.  Un plan de alimentacin saludable lo ayudar a controlar la glucemia y Theatre manager un estilo de vida saludable.  El mdico puede recomendarle que trabaje con un nutricionista para elaborar el mejor plan para usted.  Tenga en cuenta que los carbohidratos (hidratos de carbono) y el alcohol tienen efectos inmediatos en sus niveles de glucemia. Es importante contar los carbohidratos que ingiere y consumir alcohol con prudencia. Esta informacin no tiene Marine scientist el consejo del mdico. Asegrese de hacerle al mdico cualquier pregunta que tenga. Document Revised: 07/06/2019 Document Reviewed: 07/06/2019 Elsevier Patient Education  2021 Elsevier Inc.      Agustina Caroli, MD [facility]

## 2020-08-13 ENCOUNTER — Other Ambulatory Visit: Payer: Self-pay | Admitting: Emergency Medicine

## 2020-08-13 DIAGNOSIS — E1169 Type 2 diabetes mellitus with other specified complication: Secondary | ICD-10-CM

## 2020-08-13 DIAGNOSIS — E785 Hyperlipidemia, unspecified: Secondary | ICD-10-CM

## 2020-08-13 LAB — CMP14+EGFR
ALT: 104 IU/L — ABNORMAL HIGH (ref 0–32)
AST: 59 IU/L — ABNORMAL HIGH (ref 0–40)
Albumin/Globulin Ratio: 1.3 (ref 1.2–2.2)
Albumin: 4.3 g/dL (ref 3.8–4.8)
Alkaline Phosphatase: 132 IU/L — ABNORMAL HIGH (ref 44–121)
BUN/Creatinine Ratio: 17 (ref 9–23)
BUN: 11 mg/dL (ref 6–24)
Bilirubin Total: <0.2 mg/dL (ref 0.0–1.2)
CO2: 22 mmol/L (ref 20–29)
Calcium: 9.2 mg/dL (ref 8.7–10.2)
Chloride: 102 mmol/L (ref 96–106)
Creatinine, Ser: 0.64 mg/dL (ref 0.57–1.00)
Globulin, Total: 3.4 g/dL (ref 1.5–4.5)
Glucose: 150 mg/dL — ABNORMAL HIGH (ref 65–99)
Potassium: 4.1 mmol/L (ref 3.5–5.2)
Sodium: 139 mmol/L (ref 134–144)
Total Protein: 7.7 g/dL (ref 6.0–8.5)
eGFR: 108 mL/min/{1.73_m2} (ref >59)

## 2020-08-13 LAB — LIPID PANEL
Chol/HDL Ratio: 3 ratio (ref 0.0–4.4)
Cholesterol, Total: 169 mg/dL (ref 100–199)
HDL: 56 mg/dL (ref >39)
LDL Chol Calc (NIH): 75 mg/dL (ref 0–99)
Triglycerides: 234 mg/dL — ABNORMAL HIGH (ref 0–149)
VLDL Cholesterol Cal: 38 mg/dL (ref 5–40)

## 2020-08-13 MED ORDER — ROSUVASTATIN CALCIUM 10 MG PO TABS: 10.0000 mg | ORAL_TABLET | Freq: Every day | ORAL | 3 refills | Status: AC

## 2020-11-14 ENCOUNTER — Ambulatory Visit: Payer: Self-pay | Admitting: Emergency Medicine

## 2024-07-20 ENCOUNTER — Telehealth: Payer: Self-pay

## 2024-07-20 NOTE — Telephone Encounter (Signed)
 Telephoned patient at mobile number using interpreter#[phone]. Left a voice message with [facility] (referral) scheduling contact information.
